# Patient Record
Sex: Female | Born: 1980 | Race: White | Hispanic: No | Marital: Married | Smoking: Never smoker
Health system: Southern US, Community
[De-identification: ages and names within clinical notes are randomized; demographics above are authoritative.]

## PROBLEM LIST (undated history)

## (undated) DIAGNOSIS — E669 Obesity, unspecified: Secondary | ICD-10-CM

## (undated) DIAGNOSIS — F329 Major depressive disorder, single episode, unspecified: Secondary | ICD-10-CM

## (undated) DIAGNOSIS — O26899 Other specified pregnancy related conditions, unspecified trimester: Secondary | ICD-10-CM

## (undated) DIAGNOSIS — R51 Headache: Secondary | ICD-10-CM

## (undated) DIAGNOSIS — Z8719 Personal history of other diseases of the digestive system: Secondary | ICD-10-CM

## (undated) DIAGNOSIS — R12 Heartburn: Secondary | ICD-10-CM

## (undated) DIAGNOSIS — G43909 Migraine, unspecified, not intractable, without status migrainosus: Secondary | ICD-10-CM

## (undated) DIAGNOSIS — F32A Depression, unspecified: Secondary | ICD-10-CM

## (undated) DIAGNOSIS — Z9889 Other specified postprocedural states: Secondary | ICD-10-CM

## (undated) DIAGNOSIS — R112 Nausea with vomiting, unspecified: Secondary | ICD-10-CM

## (undated) DIAGNOSIS — F419 Anxiety disorder, unspecified: Secondary | ICD-10-CM

## (undated) HISTORY — DX: Migraine, unspecified, not intractable, without status migrainosus: G43.909

## (undated) HISTORY — DX: Obesity, unspecified: E66.9

## (undated) HISTORY — PX: TONSILLECTOMY: SUR1361

## (undated) HISTORY — PX: WISDOM TOOTH EXTRACTION: SHX21

## (undated) HISTORY — PX: TONSILLECTOMY AND ADENOIDECTOMY: SHX28

## (undated) HISTORY — PX: BREAST REDUCTION SURGERY: SHX8

---

## 2001-03-12 HISTORY — PX: LIPOSUCTION: SHX10

## 2001-03-12 HISTORY — PX: BREAST REDUCTION SURGERY: SHX8

## 2003-04-30 ENCOUNTER — Other Ambulatory Visit: Admission: RE | Admit: 2003-04-30 | Discharge: 2003-04-30 | Payer: Self-pay | Admitting: Obstetrics & Gynecology

## 2003-10-04 ENCOUNTER — Encounter: Admission: RE | Admit: 2003-10-04 | Discharge: 2003-10-04 | Payer: Self-pay | Admitting: Obstetrics and Gynecology

## 2006-06-03 ENCOUNTER — Encounter: Admission: RE | Admit: 2006-06-03 | Discharge: 2006-09-01 | Payer: Self-pay | Admitting: Surgery

## 2006-06-07 ENCOUNTER — Ambulatory Visit (HOSPITAL_COMMUNITY): Admission: RE | Admit: 2006-06-07 | Discharge: 2006-06-07 | Payer: Self-pay | Admitting: Surgery

## 2006-06-28 ENCOUNTER — Ambulatory Visit (HOSPITAL_COMMUNITY): Admission: RE | Admit: 2006-06-28 | Discharge: 2006-06-28 | Payer: Self-pay | Admitting: Surgery

## 2007-05-27 ENCOUNTER — Encounter: Admission: RE | Admit: 2007-05-27 | Discharge: 2007-08-25 | Payer: Self-pay | Admitting: Family Medicine

## 2007-12-05 ENCOUNTER — Ambulatory Visit (HOSPITAL_COMMUNITY): Admission: RE | Admit: 2007-12-05 | Discharge: 2007-12-05 | Payer: Self-pay | Admitting: Surgery

## 2007-12-30 ENCOUNTER — Encounter: Admission: RE | Admit: 2007-12-30 | Discharge: 2008-01-20 | Payer: Self-pay | Admitting: Surgery

## 2008-02-02 ENCOUNTER — Encounter: Admission: RE | Admit: 2008-02-02 | Discharge: 2008-05-02 | Payer: Self-pay | Admitting: Surgery

## 2008-02-10 HISTORY — PX: HERNIA REPAIR: SHX51

## 2008-02-10 HISTORY — PX: LAPAROSCOPIC GASTRIC BANDING: SHX1100

## 2008-02-16 ENCOUNTER — Ambulatory Visit (HOSPITAL_COMMUNITY): Admission: RE | Admit: 2008-02-16 | Discharge: 2008-02-17 | Payer: Self-pay | Admitting: Surgery

## 2009-06-20 ENCOUNTER — Inpatient Hospital Stay (HOSPITAL_COMMUNITY): Admission: AD | Admit: 2009-06-20 | Discharge: 2009-06-24 | Payer: Self-pay | Admitting: Obstetrics

## 2009-06-22 ENCOUNTER — Encounter (INDEPENDENT_AMBULATORY_CARE_PROVIDER_SITE_OTHER): Payer: Self-pay | Admitting: Obstetrics

## 2009-06-25 ENCOUNTER — Encounter: Admission: RE | Admit: 2009-06-25 | Discharge: 2009-07-25 | Payer: Self-pay | Admitting: Obstetrics

## 2009-07-26 ENCOUNTER — Encounter: Admission: RE | Admit: 2009-07-26 | Discharge: 2009-08-22 | Payer: Self-pay | Admitting: Obstetrics

## 2010-05-31 LAB — CBC
HCT: 33.1 % — ABNORMAL LOW (ref 36.0–46.0)
HCT: 38.6 % (ref 36.0–46.0)
Hemoglobin: 11.3 g/dL — ABNORMAL LOW (ref 12.0–15.0)
Hemoglobin: 13.4 g/dL (ref 12.0–15.0)
MCHC: 34.2 g/dL (ref 30.0–36.0)
MCHC: 34.8 g/dL (ref 30.0–36.0)
MCV: 93.1 fL (ref 78.0–100.0)
MCV: 94.4 fL (ref 78.0–100.0)
Platelets: 171 10*3/uL (ref 150–400)
Platelets: 182 10*3/uL (ref 150–400)
RBC: 3.51 MIL/uL — ABNORMAL LOW (ref 3.87–5.11)
RBC: 4.15 MIL/uL (ref 3.87–5.11)
RDW: 13.3 % (ref 11.5–15.5)
RDW: 13.9 % (ref 11.5–15.5)
WBC: 14.6 10*3/uL — ABNORMAL HIGH (ref 4.0–10.5)
WBC: 18.5 10*3/uL — ABNORMAL HIGH (ref 4.0–10.5)

## 2010-05-31 LAB — GLUCOSE, CAPILLARY
Glucose-Capillary: 120 mg/dL — ABNORMAL HIGH (ref 70–99)
Glucose-Capillary: 75 mg/dL (ref 70–99)
Glucose-Capillary: 79 mg/dL (ref 70–99)
Glucose-Capillary: 82 mg/dL (ref 70–99)
Glucose-Capillary: 84 mg/dL (ref 70–99)
Glucose-Capillary: 85 mg/dL (ref 70–99)
Glucose-Capillary: 99 mg/dL (ref 70–99)

## 2010-05-31 LAB — TYPE AND SCREEN
ABO/RH(D): O POS
Antibody Screen: NEGATIVE

## 2010-05-31 LAB — RPR: RPR Ser Ql: NONREACTIVE

## 2010-05-31 LAB — ABO/RH: ABO/RH(D): O POS

## 2010-07-25 NOTE — Op Note (Signed)
NAMETEARAH, SAULSBURY               ACCOUNT NO.:  [id number]   MEDICAL RECORD NO.:  [id number]          PATIENT TYPE:  AMB   LOCATION:  DAY                          FACILITY:  [facility]   PHYSICIAN:  Sandria Bales. Ezzard Standing, M.D.  DATE OF BIRTH:  June 25, 1980   DATE OF PROCEDURE:  DATE OF DISCHARGE:                               OPERATIVE REPORT   Date of surgery ??   PREOPERATIVE DIAGNOSES:  1. Morbid obesity (weight 255, BMI of 43.65).  2. Small hiatal hernia.   POSTOPERATIVE DIAGNOSES:  1. Morbid obesity.  (Weight 255, BMI 43.65).  2. Small hiatal hernia.   PROCEDURES:  1. Repair of hiatal hernia (2-suture posterior repair).  2. Lap-Band placed with AP standard band.  3. Port sewn to fascia.   SURGEON:  Dr. Ezzard Standing.   FIRST ASSISTANT:  Sharlet Salina T. Hoxworth, M.D.   ANESTHESIA:  General endotracheal with approximately 3 cc of 0.25%  Marcaine.   COMPLICATIONS:  Ms. Erker is a 30 year old white female, patient of  Dr. Henrine Screws who has been through our preop bariatric probe and is  interested  in a Lap-Band placed.  On her preop evaluation she does have  reflux and on upper GI does have a small to medium hiatal hernia.   The risks for Lap-Band include bleeding, infection, perforation of the  bowel, slippage or erosion of the bowel and long-term nutritional  consequences.   Also I talked to her to possibly try to fix the hiatal hernia and the  risks of hiatel hernia repair are similar potential complications as the  band.   OPERATIVE NOTE:  The patient placed in supine position, given a general  endotracheal anesthetic.  Her abdomen was prepped with a Techni-Care  generic.  She had Ancef at the initiation of the procedure.  PAS  Stockings were in place.  She was sterilely draped.  A time-out was held  identifying the patient and procedure.   I accessed the abdominal cavity through the left upper quadrant with an  Optiview, 11 mm Ethicon trocar.  I placed 5 additional trocars,  a 5 mm  in the subxiphoid location, a 15 mm in the right subcostal, a 12 mm the  right paramedian, and an 11 mm in the left paramedian and then a 5-mm  lateral trocar.   The right and left lobes of liver were unremarkable.  The anterior wall  of the stomach was unremarkable.  The bowel that I could see was  unremarkable.   I then looked up the hiatus.  She does have a hiatal hernia which is  approximately 4 to 5 cm.  I did not do any kind of balloon test.   I started my dissection then, dissecting along the anterior surface of  the esophagus, I identified the left crus, identified the right crus and  went around posterior and got behind the esophagus, identified the left  crus behind the esophagus.   She had a defect which was probably 4-5 cm.  I repaired this with two 0  Ethibond sutures without pledgets that closed this hiatus down to about  3.5 cm.  I then had anesthesia pass the Lap-Band balloon into the  stomach, insufflated with 15 mL and it was pulled until it was snug so  it looked like this at least closed the hiatus to a small enough  diameter to make it snug to our balloon test.   I then inserted an AP standard band into the abdomen.  I passed this in  our normal fashion behind the gastroesophageal junction and I  used a  finger to pass from the right side to the left side, then captured the  Silastic balloon and pulled this around the proximal stomach.   I put the sizer back into the stomach and I cinched the tube down of the  sizer which she did easily with and she pulled the sizer out and the  band seemed to lie in a good location.  I then imbricated the stomach  over the band using 0-Ethibond suture.  I used 3 of these sutures,  again, imbricated this over the AP standard band.  The band seemed to  lie without having much tension over it.  There was a little oozing from  1 suture.   I thought the band laid flat.  I took photos both of the band and the  hiatal  hernia.  I then removed the Silastic tubing out through the right  mid abdominal port.  I removed the other trocars in turn.  The trocar  site was also removed.   I then developed a pocket in the right mid abdomen.  I put the tubing to  the reservoir, sewed the reservoir in place with a port in place with a  0-Prolene suture and this laid flat against the fascia.  I then  irrigated the wound, closed the subcutaneous tissue with 3-0 Vicryl  suture, closed the skin with 5-0 Vicryl, reduced the site.  The wound  was tacked with Dermabond.   The patient tolerated the procedure well, was transported to recovery  room in good condition.  Sponge and needle counts  were correct at the  end of the case.      Sandria Bales. Ezzard Standing, M.D.  Electronically Signed     DHN/MEDQ  D:  02/16/2008  T:  02/16/2008  Job:  [id number]   cc:   Chales Salmon. Abigail Miyamoto, M.D.  Fax: [phone]   Adonis Housekeeper, Ph.D.  Fax: [phone]

## 2010-09-27 ENCOUNTER — Encounter (INDEPENDENT_AMBULATORY_CARE_PROVIDER_SITE_OTHER): Payer: Self-pay | Admitting: Surgery

## 2010-09-27 LAB — ABO/RH: RH Type: POSITIVE

## 2010-09-27 LAB — RUBELLA ANTIBODY, IGM: Rubella: IMMUNE

## 2010-09-27 LAB — HEPATITIS B SURFACE ANTIGEN: Hepatitis B Surface Ag: NEGATIVE

## 2010-09-27 LAB — HIV ANTIBODY (ROUTINE TESTING W REFLEX): HIV: NONREACTIVE

## 2010-10-05 ENCOUNTER — Ambulatory Visit (INDEPENDENT_AMBULATORY_CARE_PROVIDER_SITE_OTHER): Payer: BC Managed Care – PPO | Admitting: Surgery

## 2010-10-05 DIAGNOSIS — Z9884 Bariatric surgery status: Secondary | ICD-10-CM

## 2010-10-05 DIAGNOSIS — Z331 Pregnant state, incidental: Secondary | ICD-10-CM

## 2010-10-05 NOTE — Progress Notes (Signed)
Jacqueline Brooks is a 30 year old white female, a patient of Dr. Levonne Lapping, who had an AP standard lap band placed by me on  16 February 2008.  I last saw all the patient on 12 August 2009 soon after her baby boy was born. She now comes back again because she is pregnant and expecting in February of 2013.  She does not want any fluid removed.  She was encouraged to see [place] by her Ob/Gyn Dr. Payton Doughty.  She has no new issues. We talked to her about her lap band and the management around her pregnancy. Last pregnancy she had some nausea and vomiting right before her delivery which was a C-section. She may want to have some fluid removed from her band close to the date of her delivery. I told her we would work with her for whatever would makes her comfortable. I would be happy to see her for any problems.  Physical exam: Wt. 209.  BMI 35.8. Abdomen: Soft. Incisions well healed. LAP-BAND port palpable.  1.  Lap band, AP standard  Doing well.  She is down about 60 pounds from her pre surgery weight.  But she has recently added about 10 pounds since she found out she was pregnant.  2.  Pregnant.  Due around Feb. 2013. She may contact us about 1 week before delivery.  Last pregnancy (she has a 4 month old boy) she has some vomiting one week before the delivery.  Last time she had a C section.

## 2010-10-05 NOTE — Patient Instructions (Addendum)
1.  Congratulations on pregnancy.  2.  I will be happy to see to remove fluid before your delivery.  If not then, let me see you Dec. 2013 (your lap band anniversary).  3.  You're doing well with the lap band.

## 2010-12-15 LAB — COMPREHENSIVE METABOLIC PANEL
ALT: 18 U/L (ref 0–35)
AST: 22 U/L (ref 0–37)
Albumin: 3.8 g/dL (ref 3.5–5.2)
Alkaline Phosphatase: 42 U/L (ref 39–117)
BUN: 9 mg/dL (ref 6–23)
CO2: 25 mEq/L (ref 19–32)
Calcium: 9.3 mg/dL (ref 8.4–10.5)
Chloride: 106 mEq/L (ref 96–112)
Creatinine, Ser: 1.08 mg/dL (ref 0.4–1.2)
GFR calc Af Amer: >60 mL/min (ref >60)
GFR calc non Af Amer: >60 mL/min (ref >60)
Glucose, Bld: 89 mg/dL (ref 70–99)
Potassium: 4.1 mEq/L (ref 3.5–5.1)
Sodium: 140 mEq/L (ref 135–145)
Total Bilirubin: 1 mg/dL (ref 0.3–1.2)
Total Protein: 6.4 g/dL (ref 6.0–8.3)

## 2010-12-15 LAB — DIFFERENTIAL
Basophils Absolute: 0 10*3/uL (ref 0.0–0.1)
Basophils Absolute: 0.1 10*3/uL (ref 0.0–0.1)
Basophils Relative: 0 % (ref 0–1)
Basophils Relative: 1 % (ref 0–1)
Eosinophils Absolute: 0 10*3/uL (ref 0.0–0.7)
Eosinophils Absolute: 0.1 10*3/uL (ref 0.0–0.7)
Eosinophils Relative: 0 % (ref 0–5)
Eosinophils Relative: 0 % (ref 0–5)
Lymphocytes Relative: 10 % — ABNORMAL LOW (ref 12–46)
Lymphocytes Relative: 25 % (ref 12–46)
Lymphs Abs: 1.5 10*3/uL (ref 0.7–4.0)
Lymphs Abs: 3.1 10*3/uL (ref 0.7–4.0)
Monocytes Absolute: 0.8 10*3/uL (ref 0.1–1.0)
Monocytes Absolute: 1.2 10*3/uL — ABNORMAL HIGH (ref 0.1–1.0)
Monocytes Relative: 7 % (ref 3–12)
Monocytes Relative: 8 % (ref 3–12)
Neutro Abs: 12.4 10*3/uL — ABNORMAL HIGH (ref 1.7–7.7)
Neutro Abs: 8.4 10*3/uL — ABNORMAL HIGH (ref 1.7–7.7)
Neutrophils Relative %: 67 % (ref 43–77)
Neutrophils Relative %: 82 % — ABNORMAL HIGH (ref 43–77)

## 2010-12-15 LAB — CBC
HCT: 41.8 % (ref 36.0–46.0)
HCT: 42.4 % (ref 36.0–46.0)
Hemoglobin: 14.3 g/dL (ref 12.0–15.0)
Hemoglobin: 14.4 g/dL (ref 12.0–15.0)
MCHC: 33.9 g/dL (ref 30.0–36.0)
MCHC: 34.1 g/dL (ref 30.0–36.0)
MCV: 90.8 fL (ref 78.0–100.0)
MCV: 91.2 fL (ref 78.0–100.0)
Platelets: 314 10*3/uL (ref 150–400)
Platelets: 330 10*3/uL (ref 150–400)
RBC: 4.59 MIL/uL (ref 3.87–5.11)
RBC: 4.67 MIL/uL (ref 3.87–5.11)
RDW: 12.5 % (ref 11.5–15.5)
RDW: 13.2 % (ref 11.5–15.5)
WBC: 12.5 10*3/uL — ABNORMAL HIGH (ref 4.0–10.5)
WBC: 15.2 10*3/uL — ABNORMAL HIGH (ref 4.0–10.5)

## 2010-12-15 LAB — PREGNANCY, URINE: Preg Test, Ur: NEGATIVE

## 2010-12-18 ENCOUNTER — Other Ambulatory Visit: Payer: Self-pay | Admitting: Dermatology

## 2011-02-05 LAB — RPR: RPR: NONREACTIVE

## 2011-04-13 ENCOUNTER — Other Ambulatory Visit: Payer: Self-pay | Admitting: Obstetrics

## 2011-04-13 ENCOUNTER — Encounter (INDEPENDENT_AMBULATORY_CARE_PROVIDER_SITE_OTHER): Payer: Self-pay

## 2011-04-13 ENCOUNTER — Ambulatory Visit (INDEPENDENT_AMBULATORY_CARE_PROVIDER_SITE_OTHER): Payer: BC Managed Care – PPO | Admitting: Physician Assistant

## 2011-04-13 ENCOUNTER — Encounter (HOSPITAL_COMMUNITY): Payer: Self-pay

## 2011-04-13 VITALS — BP 112/76 | HR 68 | Temp 97.6°F | Resp 18 | Ht 64.0 in | Wt 226.2 lb

## 2011-04-13 DIAGNOSIS — Z4651 Encounter for fitting and adjustment of gastric lap band: Secondary | ICD-10-CM

## 2011-04-13 NOTE — Patient Instructions (Signed)
Return as scheduled 

## 2011-04-13 NOTE — Progress Notes (Signed)
  HISTORY: Jacqueline Brooks is a 31 y.o.female who received an AP-Standard lap-band in December 2009 by Dr. Ezzard Standing. She is delivering via c-section on 2/15 and would like fluid removed as she had nausea and vomiting following her last c-section. Otherwise, she has no complaints.Marland Kitchen  VITAL SIGNS: Filed Vitals:   04/13/11 1618  BP: 112/76  Pulse: 68  Temp: 97.6 F (36.4 C)  Resp: 18    PHYSICAL EXAM: Physical exam reveals a very well-appearing 31 y.o.female in no apparent distress Neurologic: Awake, alert, oriented Psych: Bright affect, conversant Respiratory: Breathing even and unlabored. No stridor or wheezing Abdomen: Soft, nontender, nondistended to palpation. Incisions well-healed. No incisional hernias. Port easily palpated. Extremities: Atraumatic, good range of motion.  ASSESMENT: 31 y.o.  female  s/p AP-Standard lap-band.   PLAN: The patient's port was accessed with a 20G Huber needle without difficulty. Clear fluid was aspirated and 5.5 mL saline was removed from the port to give a total predicted volume of 0 mL.

## 2011-04-16 ENCOUNTER — Encounter (HOSPITAL_COMMUNITY)
Admission: RE | Admit: 2011-04-16 | Discharge: 2011-04-16 | Disposition: A | Discharge status: Home / Self Care | Payer: BC Managed Care – PPO | Source: Ambulatory Visit | Attending: Obstetrics | Admitting: Obstetrics

## 2011-04-16 ENCOUNTER — Encounter (HOSPITAL_COMMUNITY): Payer: Self-pay

## 2011-04-16 HISTORY — DX: Anxiety disorder, unspecified: F41.9

## 2011-04-16 HISTORY — DX: Major depressive disorder, single episode, unspecified: F32.9

## 2011-04-16 HISTORY — DX: Other specified pregnancy related conditions, unspecified trimester: O26.899

## 2011-04-16 HISTORY — DX: Depression, unspecified: F32.A

## 2011-04-16 HISTORY — DX: Other specified postprocedural states: Z98.890

## 2011-04-16 HISTORY — DX: Nausea with vomiting, unspecified: R11.2

## 2011-04-16 HISTORY — DX: Personal history of other diseases of the digestive system: Z87.19

## 2011-04-16 HISTORY — DX: Heartburn: R12

## 2011-04-16 HISTORY — DX: Headache: R51

## 2011-04-16 LAB — CBC
HCT: 37 % (ref 36.0–46.0)
Hemoglobin: 12.1 g/dL (ref 12.0–15.0)
MCH: 30.4 pg (ref 26.0–34.0)
MCHC: 32.7 g/dL (ref 30.0–36.0)
MCV: 93 fL (ref 78.0–100.0)
Platelets: 207 10*3/uL (ref 150–400)
RBC: 3.98 MIL/uL (ref 3.87–5.11)
RDW: 13.6 % (ref 11.5–15.5)
WBC: 13.4 10*3/uL — ABNORMAL HIGH (ref 4.0–10.5)

## 2011-04-16 LAB — SURGICAL PCR SCREEN
MRSA, PCR: NEGATIVE
Staphylococcus aureus: NEGATIVE

## 2011-04-16 NOTE — Patient Instructions (Addendum)
   Your procedure is scheduled on: Friday, Feb 15th  Enter through the Main Entrance of [facility] at: [employer] up the phone at the desk and dial [phone] and inform us of your arrival.  Please call this number if you have any problems the morning of surgery: [phone]  Remember: Do not eat food after midnight: Thursday Do not drink clear liquids after: Thursday Take these medicines the morning of surgery with a SIP OF WATER: prozac  Do not wear jewelry, make-up, or FINGER nail polish Do not wear lotions, powders, perfumes or deodorant. Do not shave 48 hours prior to surgery. Do not bring valuables to the hospital.  Leave suitcase in the car. After Surgery it may be brought to your room. For patients being admitted to the hospital, checkout time is 11:00am the day of discharge. Home with Husband Shadee Rathod cell [phone]  Patients discharged on the day of surgery will not be allowed to drive home.     Remember to use your hibiclens as instructed.Please shower with 1/2 bottle the evening before your surgery and the other 1/2 bottle the morning of surgery.

## 2011-04-16 NOTE — Pre-Procedure Instructions (Signed)
Ok to see patient DOS. 

## 2011-04-17 LAB — RPR: RPR Ser Ql: NONREACTIVE

## 2011-04-27 ENCOUNTER — Inpatient Hospital Stay (HOSPITAL_COMMUNITY)
Admission: RE | Admit: 2011-04-27 | Discharge: 2011-04-29 | DRG: 371 | Disposition: A | Discharge status: Home / Self Care | Payer: BC Managed Care – PPO | Source: Ambulatory Visit | Attending: Obstetrics | Admitting: Obstetrics

## 2011-04-27 ENCOUNTER — Encounter (HOSPITAL_COMMUNITY)
Admission: RE | Disposition: A | Discharge status: Home / Self Care | Payer: Self-pay | Source: Ambulatory Visit | Attending: Obstetrics

## 2011-04-27 ENCOUNTER — Encounter (HOSPITAL_COMMUNITY): Payer: Self-pay | Admitting: Anesthesiology

## 2011-04-27 ENCOUNTER — Inpatient Hospital Stay (HOSPITAL_COMMUNITY): Payer: BC Managed Care – PPO | Admitting: Anesthesiology

## 2011-04-27 ENCOUNTER — Encounter (HOSPITAL_COMMUNITY): Payer: Self-pay | Admitting: *Deleted

## 2011-04-27 DIAGNOSIS — O339 Maternal care for disproportion, unspecified: Secondary | ICD-10-CM | POA: Diagnosis present

## 2011-04-27 DIAGNOSIS — O99844 Bariatric surgery status complicating childbirth: Secondary | ICD-10-CM | POA: Diagnosis present

## 2011-04-27 DIAGNOSIS — O330 Maternal care for disproportion due to deformity of maternal pelvic bones: Secondary | ICD-10-CM | POA: Diagnosis present

## 2011-04-27 DIAGNOSIS — O34219 Maternal care for unspecified type scar from previous cesarean delivery: Principal | ICD-10-CM | POA: Diagnosis present

## 2011-04-27 LAB — URINALYSIS, ROUTINE W REFLEX MICROSCOPIC
Bilirubin Urine: NEGATIVE
Glucose, UA: NEGATIVE mg/dL
Hgb urine dipstick: NEGATIVE
Ketones, ur: NEGATIVE mg/dL
Nitrite: NEGATIVE
Protein, ur: NEGATIVE mg/dL
Specific Gravity, Urine: 1.015 (ref 1.005–1.030)
Urobilinogen, UA: 0.2 mg/dL (ref 0.0–1.0)
pH: 7.5 (ref 5.0–8.0)

## 2011-04-27 LAB — URINE MICROSCOPIC-ADD ON

## 2011-04-27 LAB — TYPE AND SCREEN
ABO/RH(D): O POS
Antibody Screen: NEGATIVE

## 2011-04-27 SURGERY — Surgical Case
Anesthesia: Spinal | Site: Abdomen | Wound class: Clean Contaminated

## 2011-04-27 MED ORDER — NALOXONE HCL 0.4 MG/ML IJ SOLN: 0.4000 mg | INTRAMUSCULAR | Status: DC | PRN

## 2011-04-27 MED ORDER — NALBUPHINE HCL 10 MG/ML IJ SOLN
5.0000 mg | INTRAMUSCULAR | Status: DC | PRN
  Filled 2011-04-27: qty 1

## 2011-04-27 MED ORDER — METOCLOPRAMIDE HCL 5 MG/ML IJ SOLN: 10.0000 mg | Freq: Three times a day (TID) | INTRAMUSCULAR | Status: DC | PRN

## 2011-04-27 MED ORDER — MORPHINE SULFATE (PF) 0.5 MG/ML IJ SOLN
INTRAMUSCULAR | Status: DC | PRN
  Administered 2011-04-27: .1 mg via EPIDURAL

## 2011-04-27 MED ORDER — SODIUM CHLORIDE 0.9 % IJ SOLN: 3.0000 mL | INTRAMUSCULAR | Status: DC | PRN

## 2011-04-27 MED ORDER — ZOLPIDEM TARTRATE 5 MG PO TABS: 5.0000 mg | ORAL_TABLET | Freq: Every evening | ORAL | Status: DC | PRN

## 2011-04-27 MED ORDER — ONDANSETRON HCL 4 MG PO TABS: 4.0000 mg | ORAL_TABLET | ORAL | Status: DC | PRN

## 2011-04-27 MED ORDER — OXYTOCIN 10 UNIT/ML IJ SOLN
INTRAMUSCULAR | Status: AC
  Filled 2011-04-27: qty 1

## 2011-04-27 MED ORDER — FLUOXETINE HCL 20 MG PO CAPS
20.0000 mg | ORAL_CAPSULE | Freq: Every day | ORAL | Status: DC
  Administered 2011-04-29: 20 mg via ORAL
  Filled 2011-04-27 (×3): qty 1

## 2011-04-27 MED ORDER — DIPHENHYDRAMINE HCL 50 MG/ML IJ SOLN: 25.0000 mg | INTRAMUSCULAR | Status: DC | PRN

## 2011-04-27 MED ORDER — CEFAZOLIN SODIUM 1-5 GM-% IV SOLN: 1.0000 g | INTRAVENOUS | Status: DC

## 2011-04-27 MED ORDER — IBUPROFEN 600 MG PO TABS: 600.0000 mg | ORAL_TABLET | Freq: Four times a day (QID) | ORAL | Status: DC | PRN

## 2011-04-27 MED ORDER — BUPIVACAINE IN DEXTROSE 0.75-8.25 % IT SOLN
INTRATHECAL | Status: DC | PRN
  Administered 2011-04-27: 1.4 mL via INTRATHECAL

## 2011-04-27 MED ORDER — SCOPOLAMINE 1 MG/3DAYS TD PT72
1.0000 | MEDICATED_PATCH | Freq: Once | TRANSDERMAL | Status: DC
  Filled 2011-04-27: qty 1

## 2011-04-27 MED ORDER — DIPHENHYDRAMINE HCL 25 MG PO CAPS: 25.0000 mg | ORAL_CAPSULE | Freq: Four times a day (QID) | ORAL | Status: DC | PRN

## 2011-04-27 MED ORDER — MENTHOL 3 MG MT LOZG: 1.0000 | LOZENGE | OROMUCOSAL | Status: DC | PRN

## 2011-04-27 MED ORDER — CALCIUM CARBONATE ANTACID 500 MG PO CHEW: 2.0000 | CHEWABLE_TABLET | Freq: Every day | ORAL | Status: DC

## 2011-04-27 MED ORDER — SCOPOLAMINE 1 MG/3DAYS TD PT72
1.0000 | MEDICATED_PATCH | Freq: Once | TRANSDERMAL | Status: DC
  Administered 2011-04-27: 1.5 mg via TRANSDERMAL

## 2011-04-27 MED ORDER — LACTATED RINGERS IV SOLN
INTRAVENOUS | Status: DC
  Administered 2011-04-27 (×2): via INTRAVENOUS

## 2011-04-27 MED ORDER — KETOROLAC TROMETHAMINE 60 MG/2ML IM SOLN
INTRAMUSCULAR | Status: AC
  Administered 2011-04-27: 60 mg via INTRAMUSCULAR
  Filled 2011-04-27: qty 2

## 2011-04-27 MED ORDER — ONDANSETRON HCL 4 MG/2ML IJ SOLN: 4.0000 mg | Freq: Three times a day (TID) | INTRAMUSCULAR | Status: DC | PRN

## 2011-04-27 MED ORDER — LANOLIN HYDROUS EX OINT: 1.0000 "application " | TOPICAL_OINTMENT | CUTANEOUS | Status: DC | PRN

## 2011-04-27 MED ORDER — WITCH HAZEL-GLYCERIN EX PADS: 1.0000 "application " | MEDICATED_PAD | CUTANEOUS | Status: DC | PRN

## 2011-04-27 MED ORDER — ONDANSETRON HCL 4 MG/2ML IJ SOLN
INTRAMUSCULAR | Status: AC
  Filled 2011-04-27: qty 2

## 2011-04-27 MED ORDER — ONDANSETRON HCL 4 MG/2ML IJ SOLN
INTRAMUSCULAR | Status: DC | PRN
  Administered 2011-04-27: 4 mg via INTRAVENOUS

## 2011-04-27 MED ORDER — SENNOSIDES-DOCUSATE SODIUM 8.6-50 MG PO TABS
2.0000 | ORAL_TABLET | Freq: Every day | ORAL | Status: DC
  Administered 2011-04-27 – 2011-04-28 (×2): 2 via ORAL

## 2011-04-27 MED ORDER — LACTATED RINGERS IV SOLN
INTRAVENOUS | Status: DC
  Administered 2011-04-27: 07:00:00 via INTRAVENOUS

## 2011-04-27 MED ORDER — OXYTOCIN 10 UNIT/ML IJ SOLN
INTRAMUSCULAR | Status: AC
  Filled 2011-04-27: qty 2

## 2011-04-27 MED ORDER — FENTANYL CITRATE 0.05 MG/ML IJ SOLN
INTRAMUSCULAR | Status: DC | PRN
  Administered 2011-04-27: 12.5 ug via INTRAVENOUS

## 2011-04-27 MED ORDER — VITAMIN D3 25 MCG (1000 UNIT) PO TABS
2000.0000 [IU] | ORAL_TABLET | Freq: Every day | ORAL | Status: DC
  Administered 2011-04-28 – 2011-04-29 (×2): 2000 [IU] via ORAL
  Filled 2011-04-27 (×3): qty 2

## 2011-04-27 MED ORDER — DIPHENHYDRAMINE HCL 50 MG/ML IJ SOLN: 12.5000 mg | INTRAMUSCULAR | Status: DC | PRN

## 2011-04-27 MED ORDER — KETOROLAC TROMETHAMINE 60 MG/2ML IM SOLN
60.0000 mg | Freq: Once | INTRAMUSCULAR | Status: AC | PRN
  Administered 2011-04-27: 60 mg via INTRAMUSCULAR

## 2011-04-27 MED ORDER — OXYCODONE-ACETAMINOPHEN 5-325 MG PO TABS
1.0000 | ORAL_TABLET | ORAL | Status: DC | PRN
  Administered 2011-04-28 – 2011-04-29 (×2): 1 via ORAL
  Filled 2011-04-27 (×2): qty 1

## 2011-04-27 MED ORDER — SCOPOLAMINE 1 MG/3DAYS TD PT72: 1.0000 | MEDICATED_PATCH | Freq: Once | TRANSDERMAL | Status: DC

## 2011-04-27 MED ORDER — OXYTOCIN 20 UNITS IN LACTATED RINGERS INFUSION - SIMPLE: 125.0000 mL/h | INTRAVENOUS | Status: AC

## 2011-04-27 MED ORDER — FENTANYL CITRATE 0.05 MG/ML IJ SOLN
25.0000 ug | INTRAMUSCULAR | Status: DC | PRN
  Administered 2011-04-27 (×2): 25 ug via INTRAVENOUS

## 2011-04-27 MED ORDER — FENTANYL CITRATE 0.05 MG/ML IJ SOLN
INTRAMUSCULAR | Status: AC
  Administered 2011-04-27: 25 ug via INTRAVENOUS
  Filled 2011-04-27: qty 2

## 2011-04-27 MED ORDER — SIMETHICONE 80 MG PO CHEW
80.0000 mg | CHEWABLE_TABLET | Freq: Three times a day (TID) | ORAL | Status: DC
  Administered 2011-04-27 – 2011-04-28 (×6): 80 mg via ORAL

## 2011-04-27 MED ORDER — KETOROLAC TROMETHAMINE 30 MG/ML IJ SOLN: 30.0000 mg | Freq: Four times a day (QID) | INTRAMUSCULAR | Status: AC | PRN

## 2011-04-27 MED ORDER — ONDANSETRON HCL 4 MG/2ML IJ SOLN: 4.0000 mg | INTRAMUSCULAR | Status: DC | PRN

## 2011-04-27 MED ORDER — OXYTOCIN 10 UNIT/ML IJ SOLN
INTRAMUSCULAR | Status: DC | PRN
  Administered 2011-04-27: 5 [IU]
  Administered 2011-04-27: 20 [IU]

## 2011-04-27 MED ORDER — FENTANYL CITRATE 0.05 MG/ML IJ SOLN
INTRAMUSCULAR | Status: AC
  Filled 2011-04-27: qty 2

## 2011-04-27 MED ORDER — PHENYLEPHRINE HCL 10 MG/ML IJ SOLN
INTRAMUSCULAR | Status: DC | PRN
  Administered 2011-04-27 (×6): 40 ug via INTRAVENOUS

## 2011-04-27 MED ORDER — DIBUCAINE 1 % RE OINT: 1.0000 "application " | TOPICAL_OINTMENT | RECTAL | Status: DC | PRN

## 2011-04-27 MED ORDER — LACTATED RINGERS IV SOLN
INTRAVENOUS | Status: DC
  Administered 2011-04-27: 13:00:00 via INTRAVENOUS

## 2011-04-27 MED ORDER — DIPHENHYDRAMINE HCL 25 MG PO CAPS: 25.0000 mg | ORAL_CAPSULE | ORAL | Status: DC | PRN

## 2011-04-27 MED ORDER — IBUPROFEN 600 MG PO TABS
600.0000 mg | ORAL_TABLET | Freq: Four times a day (QID) | ORAL | Status: DC
  Administered 2011-04-27 – 2011-04-29 (×7): 600 mg via ORAL
  Filled 2011-04-27 (×7): qty 1

## 2011-04-27 MED ORDER — CEFAZOLIN SODIUM 1-5 GM-% IV SOLN
INTRAVENOUS | Status: AC
  Administered 2011-04-27: 1 g via INTRAVENOUS
  Filled 2011-04-27: qty 50

## 2011-04-27 MED ORDER — SIMETHICONE 80 MG PO CHEW: 80.0000 mg | CHEWABLE_TABLET | ORAL | Status: DC | PRN

## 2011-04-27 MED ORDER — SCOPOLAMINE 1 MG/3DAYS TD PT72
MEDICATED_PATCH | TRANSDERMAL | Status: AC
  Administered 2011-04-27: 1.5 mg via TRANSDERMAL
  Filled 2011-04-27: qty 1

## 2011-04-27 MED ORDER — PRENATAL MULTIVITAMIN CH
1.0000 | ORAL_TABLET | Freq: Every day | ORAL | Status: DC
  Administered 2011-04-28 – 2011-04-29 (×2): 1 via ORAL
  Filled 2011-04-27: qty 1

## 2011-04-27 MED ORDER — L-METHYLFOLATE-B6-B12 3-35-2 MG PO TABS
1.0000 | ORAL_TABLET | Freq: Every day | ORAL | Status: DC
  Administered 2011-04-28 – 2011-04-29 (×2): 1 via ORAL
  Filled 2011-04-27 (×4): qty 1

## 2011-04-27 MED ORDER — MEPERIDINE HCL 25 MG/ML IJ SOLN: 6.2500 mg | INTRAMUSCULAR | Status: DC | PRN

## 2011-04-27 MED ORDER — TETANUS-DIPHTH-ACELL PERTUSSIS 5-2.5-18.5 LF-MCG/0.5 IM SUSP
0.5000 mL | Freq: Once | INTRAMUSCULAR | Status: AC
  Administered 2011-04-28: 0.5 mL via INTRAMUSCULAR
  Filled 2011-04-27: qty 0.5

## 2011-04-27 MED ORDER — MORPHINE SULFATE 0.5 MG/ML IJ SOLN
INTRAMUSCULAR | Status: AC
  Filled 2011-04-27: qty 10

## 2011-04-27 MED ORDER — SODIUM CHLORIDE 0.9 % IV SOLN: 1.0000 ug/kg/h | INTRAVENOUS | Status: DC | PRN

## 2011-04-27 SURGICAL SUPPLY — 24 items
ADH SKN CLS APL DERMABOND .7 (GAUZE/BANDAGES/DRESSINGS) ×1
CHLORAPREP W/TINT 26ML (MISCELLANEOUS) ×2 IMPLANT
CLOTH BEACON ORANGE TIMEOUT ST (SAFETY) ×2 IMPLANT
DERMABOND ADVANCED (GAUZE/BANDAGES/DRESSINGS) ×1
DERMABOND ADVANCED .7 DNX12 (GAUZE/BANDAGES/DRESSINGS) IMPLANT
ELECT REM PT RETURN 9FT ADLT (ELECTROSURGICAL) ×2
ELECTRODE REM PT RTRN 9FT ADLT (ELECTROSURGICAL) ×1 IMPLANT
GLOVE BIO SURGEON STRL SZ 6.5 (GLOVE) ×2 IMPLANT
GLOVE BIOGEL PI IND STRL 7.0 (GLOVE) ×2 IMPLANT
GLOVE BIOGEL PI INDICATOR 7.0 (GLOVE) ×2
GOWN PREVENTION PLUS LG XLONG (DISPOSABLE) ×6 IMPLANT
NS IRRIG 1000ML POUR BTL (IV SOLUTION) ×2 IMPLANT
PACK C SECTION WH (CUSTOM PROCEDURE TRAY) ×2 IMPLANT
STAPLER VISISTAT 35W (STAPLE) IMPLANT
SUT MON AB 4-0 PS1 27 (SUTURE) ×1 IMPLANT
SUT PLAIN 2 0 XLH (SUTURE) ×1 IMPLANT
SUT VIC AB 0 CT1 36 (SUTURE) ×5 IMPLANT
SUT VIC AB 0 CTX 36 (SUTURE) ×4
SUT VIC AB 0 CTX36XBRD ANBCTRL (SUTURE) ×3 IMPLANT
SUT VIC AB 2-0 CT1 27 (SUTURE) ×2
SUT VIC AB 2-0 CT1 TAPERPNT 27 (SUTURE) ×1 IMPLANT
TOWEL OR 17X24 6PK STRL BLUE (TOWEL DISPOSABLE) ×4 IMPLANT
TRAY FOLEY CATH 14FR (SET/KITS/TRAYS/PACK) ×1 IMPLANT
WATER STERILE IRR 1000ML POUR (IV SOLUTION) ×2 IMPLANT

## 2011-04-27 NOTE — Brief Op Note (Signed)
04/27/2011  8:48 AM  PATIENT:  Jacqueline Brooks  31 y.o. female  PRE-OPERATIVE DIAGNOSIS:  Previous Cesarean Section  POST-OPERATIVE DIAGNOSIS:  Previous Cesarean Section  PROCEDURE:  Procedure(s) (LRB): CESAREAN SECTION (N/A), LTCS w/ 2 layer closure  SURGEON:  Surgeon(s) and Role:    * Quitman Norberto A. Ernestina Penna, MD - Primary    * Genia Del, MD - Assisting  PHYSICIAN ASSISTANT:   ASSISTANTS: Lavoie   ANESTHESIA:   spinal  EBL:  Total I/O In: 3000 [I.V.:3000] Out: 1000 [Urine:300; Blood:700]  BLOOD ADMINISTERED:none  DRAINS: Urinary Catheter (Foley)   LOCAL MEDICATIONS USED:  NONE  SPECIMEN:  Source of Specimen:  placenta  DISPOSITION OF SPECIMEN:  L&D  COUNTS:  YES  TOURNIQUET:  * No tourniquets in log *  DICTATION: .Note written in EPIC  PLAN OF CARE: Admit to inpatient   PATIENT DISPOSITION:  PACU - hemodynamically stable.   Delay start of Pharmacological VTE agent (>24hrs) due to surgical blood loss or risk of bleeding: yes

## 2011-04-27 NOTE — Anesthesia Procedure Notes (Signed)
Spinal  Patient location during procedure: OR Start time: 04/27/2011 7:46 AM End time: 04/27/2011 7:49 AM Staffing Anesthesiologist: Sandrea Hughs Performed by: anesthesiologist  Preanesthetic Checklist Completed: patient identified, site marked, surgical consent, pre-op evaluation, timeout performed, IV checked, risks and benefits discussed and monitors and equipment checked Spinal Block Patient position: sitting Prep: DuraPrep Patient monitoring: heart rate, cardiac monitor, continuous pulse ox and blood pressure Approach: midline Location: L3-4 Injection technique: single-shot Needle Needle type: Sprotte  Needle gauge: 24 G Needle length: 9 cm Needle insertion depth: 6 cm Assessment Sensory level: T4

## 2011-04-27 NOTE — Addendum Note (Signed)
Addendum  created 04/27/11 1935 by Rosalia Hammers, CRNA   Modules edited:Notes Section

## 2011-04-27 NOTE — Anesthesia Postprocedure Evaluation (Signed)
  Anesthesia Post-op Note  Patient: Jacqueline Brooks  Procedure(s) Performed: Procedure(s) (LRB): CESAREAN SECTION (N/A)  Patient Location: PACU  Anesthesia Type: Spinal  Level of Consciousness: awake, alert  and oriented  Airway and Oxygen Therapy: Patient Spontanous Breathing  Post-op Pain: none  Post-op Assessment: Post-op Vital signs reviewed, Patient's Cardiovascular Status Stable, Respiratory Function Stable, Patent Airway, No signs of Nausea or vomiting, Pain level controlled, No headache and No backache  Post-op Vital Signs: Reviewed and stable  Complications: No apparent anesthesia complications

## 2011-04-27 NOTE — Op Note (Signed)
Preoperative diagnosis: prior c/s for CPD  Postop diagnosis:  same Procedure: RCS Surgeon: Viviann Spare Assistant:. Seymour Bars Anesthesia: spinal Findings:  @BABYSEXEBC @ infant,  APGAR (1 MIN): 9   APGAR (5 MINS): 9   APGAR (10 MINS):   @BABYWGTLBSEBC @ @BABYWGTOZEBC @ Wt 7'12 EBL: 700 cc Antibiotics:  1g Ancef  Complications: none  Indications: This is a 31 y.o. year-old, G2P1  At [redacted]w[redacted]d admitted for RCS. Risks benefits and alternatives of the procedure were discussed with the patient who agreed to proceed  Procedure:  After informed consent was obtained the patient was taken to the operating room where spinal anesthesia was initiated.  She was prepped and draped in the normal sterile fashion in dorsal supine position with a leftward tilt.  A foley catheter was inserted sterilely into the bladder. Gloves were changes and attention was turned to the patient's abdomen. A Pfannenstiel skin incision was made 2 cm above the pubic symphysis in the midline with the scalpel, over the old incision.  Dissection was carried down with the Bovie cautery until the fascia was reached. The fascia was incised in the midline. The incision was extended laterally with the Mayo scissors. The inferior aspect of the fascial incision was grasped with the Coker clamps, elevated up and the underlying rectus muscles were dissected off sharply. The superior aspect of the fascial incision was grasped with the Coker clamps elevated up and the underlying rectus muscles were dissected off sharply.  The peritoneum was entered sharply using snaps and metzenbaums. The peritoneal incision was extended superiorly and inferiorly with good visualization of the bladder. The bladder blade was inserted, the vesicouterine peritoneum was identified grasped with the pickups and entered sharply. The bladder flap was created digitally the bladder blade was reinserted. Palpation was done to assess the fetal position and the location of the uterine  vessels. The lower segment of the uterus was incised sharply with the scalpel and extended superiorly and laterally with the bandage scissors. The infant also was grasped brought to the incision,  rotated and the infant was delivered with fundal pressure. The nose and mouth were bulb suctioned. The cord was clamped and cut. The infant was handed off to the waiting pediatrician. The placenta was expressed. The uterus was exteriorized. The uterus was cleared of all clots and debris. The uterine incision was repaired with 0 Vicryl in a running locked fashion.  A second layer of the same suture was used in an imbricating fashion to obtain excellent hemostasis.  The uterus was then returned to the abdomen, the gutters were cleared of all clots and debris. The uterine incision was reinspected and found to be hemostatic. The peritoneum was grasped and closed with 2-0 Vicryl in a running fashion. The cut muscle edges and the underside of the fascia were inspected and found to be hemostatic. The fascia was closed with 0 Vicryl in two halves. The subcutaneous tissue was irrigated. Scarpa's layer was closed with a 2-0 plain gut suture. The skin was closed with a 4-0 Monocryl in a single layer. The patient tolerated the procedure well. Sponge lap and needle counts were correct x3 and patient was taken to the recovery room in a stable condition.  Attallah Ontko A. 04/27/2011 8:54 AM

## 2011-04-27 NOTE — Transfer of Care (Signed)
Immediate Anesthesia Transfer of Care Note  Patient: Jacqueline Brooks  Procedure(s) Performed: Procedure(s) (LRB): CESAREAN SECTION (N/A)  Patient Location: PACU  Anesthesia Type: Spinal  Level of Consciousness: awake, alert  and oriented  Airway & Oxygen Therapy: Patient Spontanous Breathing  Post-op Assessment: Report given to PACU RN and Post -op Vital signs reviewed and stable  Post vital signs: stable  Complications: No apparent anesthesia complications

## 2011-04-27 NOTE — H&P (Signed)
Jacqueline Brooks is a 31 y.o. G2P1001 at [redacted]w[redacted]d presenting for RCS. Pt notes no contractions. Good fetal movement, No vaginal bleeding, not leaking fluid .  [facility] since 7 wks PN Issues: - low lying placenta, resolved at 28 wkis - prior c/s for CPD- baby 8'13 - h/o lap band, needed active management and unfill in preg - depression, stable on Prozac - obesity, nl early and nl DS  OB History    Grav Para Term Preterm Abortions TAB SAB Ect Mult Living   2 1 1       1      Past Medical History  Diagnosis Date  . PONV (postoperative nausea and vomiting)   . Depression   . Anxiety   . Heartburn in pregnancy     tx with tums prn  . H/O hiatal hernia     hx - was repaired with gastric banding 02/2008  . Headache     otc med prn - last migraine 1 yr ago   Past Surgical History  Procedure Date  . Breast reduction surgery 2003  . Liposuction 2003    abd/thighs  . Laparoscopic gastric banding December 2009  . Cesarean section 06/2009  . Tonsillectomy   . Tonsillectomy and adenoidectomy   . Wisdom tooth extraction    Family History: family history is not on file. Social History:  reports that she has never smoked. She has never used smokeless tobacco. She reports that she drinks alcohol. She reports that she does not use illicit drugs.  Review of Systems - Negative except      Blood pressure 124/84, pulse 96, temperature 98.2 F (36.8 C), temperature source Oral, resp. rate 18, last menstrual period 07/25/2010, SpO2 99.00%, unknown if currently breastfeeding.  Physical Exam:  Gen: well appearing, no distress  Back: no CVAT Abd: gravid, NT, no RUQ pain LE: tr edema, equal bilaterally, non-tender Active FH  Prenatal labs: ABO, Rh: --/--/O POS (02/15 0615) Antibody: NEG (02/15 0615) Rubella:   RPR: NON REACTIVE (02/04 0916)  HBsAg: Negative (07/18 0000)  HIV: Non-reactive (07/18 0000)  GBS:   neg 1 hr Glucola 118   Genetic screening nl quad Anatomy US  nl   Assessment/Plan: 31 y.o. G2P1001 at [redacted]w[redacted]d - prior c/s for CPD, planning RCS. R/B of surgery reviewed w/ pt  - h/o depression, cont Prozac 20mg /d -h/o Vit  D def, cont supplementation - needs Tdap PP   Jacqueline Yuan A. 04/27/2011, 7:31 AM

## 2011-04-27 NOTE — Anesthesia Postprocedure Evaluation (Signed)
  Anesthesia Post-op Note  Patient: Jacqueline Brooks  Procedure(s) Performed: Procedure(s) (LRB): CESAREAN SECTION (N/A)  Patient Location: PACU and Mother/Baby  Anesthesia Type: Spinal  Level of Consciousness: awake, alert , oriented and patient cooperative  Airway and Oxygen Therapy: Patient Spontanous Breathing  Post-op Pain: none  Post-op Assessment: Post-op Vital signs reviewed  Post-op Vital Signs: Reviewed and stable  Complications: No apparent anesthesia complications

## 2011-04-27 NOTE — Anesthesia Preprocedure Evaluation (Signed)
Anesthesia Evaluation  Patient identified by MRN, date of birth, ID band Patient awake    Reviewed: Allergy & Precautions, H&P , NPO status , Patient's Chart, lab work & pertinent test results, reviewed documented beta blocker date and time   History of Anesthesia Complications Negative for: history of anesthetic complications  Airway Mallampati: I TM Distance: >3 FB Neck ROM: full    Dental  (+)    Pulmonary neg pulmonary ROS,  clear to auscultation        Cardiovascular neg cardio ROS regular Normal    Neuro/Psych  Headaches, PSYCHIATRIC DISORDERS (depression, anxiety)    GI/Hepatic Neg liver ROS, hiatal hernia, GERD-  ,  Endo/Other  Negative Endocrine ROS  Renal/GU negative Renal ROS  Genitourinary negative   Musculoskeletal   Abdominal   Peds  Hematology negative hematology ROS (+)   Anesthesia Other Findings   Reproductive/Obstetrics (+) Pregnancy (h/o prior c/s x1)                           Anesthesia Physical Anesthesia Plan  ASA: II  Anesthesia Plan: Spinal   Post-op Pain Management:    Induction:   Airway Management Planned:   Additional Equipment:   Intra-op Plan:   Post-operative Plan:   Informed Consent: I have reviewed the patients History and Physical, chart, labs and discussed the procedure including the risks, benefits and alternatives for the proposed anesthesia with the patient or authorized representative who has indicated his/her understanding and acceptance.     Plan Discussed with: Surgeon and CRNA  Anesthesia Plan Comments:         Anesthesia Quick Evaluation

## 2011-04-28 LAB — CBC
HCT: 31.3 % — ABNORMAL LOW (ref 36.0–46.0)
Hemoglobin: 10.2 g/dL — ABNORMAL LOW (ref 12.0–15.0)
MCH: 30.4 pg (ref 26.0–34.0)
MCHC: 32.6 g/dL (ref 30.0–36.0)
MCV: 93.4 fL (ref 78.0–100.0)
Platelets: 184 10*3/uL (ref 150–400)
RBC: 3.35 MIL/uL — ABNORMAL LOW (ref 3.87–5.11)
RDW: 13.7 % (ref 11.5–15.5)
WBC: 13.5 10*3/uL — ABNORMAL HIGH (ref 4.0–10.5)

## 2011-04-28 LAB — CCBB MATERNAL DONOR DRAW

## 2011-04-28 NOTE — Progress Notes (Signed)
Subjective: POD# 1 Information for the patient's newborn:  Jacqueline Brooks, Jacqueline Brooks [[id number]]  female   Reports feeling well, OOB and showered, desires early D/C tomorrow. Feeding: breast and bottle Patient reports tolerating PO.  Breast symptoms: hx breast reduction Pain controlled with Motrin and Percocet Denies HA/SOB/C/P/N/V/dizziness. Flatus present. She reports vaginal bleeding as normal, clot x 1.  She is ambulating, urinating without difficult.     Objective:   VS:  Filed Vitals:   04/27/11 1745 04/27/11 1945 04/27/11 2345 04/28/11 0540  BP: 108/59 115/62 104/66 108/71  Pulse: 56 56 60 61  Temp: 97.9 F (36.6 C) 98.3 F (36.8 C) 98.4 F (36.9 C) 97.6 F (36.4 C)  TempSrc: Oral Oral Oral Oral  Resp: 20 20 20 20   Height:      Weight:      SpO2: 98% 94% 96% 97%     Intake/Output Summary (Last 24 hours) at 04/28/11 1002 Last data filed at 04/27/11 2225  Gross per 24 hour  Intake    540 ml  Output   2625 ml  Net  -2085 ml        Basename 04/28/11 0608  WBC 13.5*  HGB 10.2*  HCT 31.3*  PLT 184     Blood type: --/--/O POS (02/15 0615)  Rubella: Immune (07/18 0000)     Physical Exam:  General: alert, cooperative and no distress CV: Regular rate and rhythm Resp: clear Abdomen: soft, nontender, normal bowel sounds Incision: clean, dry, intact and [place] suture and dermabond intact, skin edges well aprox.  Uterine Fundus: firm, below umbilicus, nontender Lochia: minimal Ext: Homans sign is negative, no sign of DVT and no edema, redness or tenderness in the calves or thighs      Assessment/Plan: 31 y.o.  POD# 1.  s/p Cesarean Delivery.  Indications: scheduled repeat                Principal Problem:  *PP care - s/p  rpt C/S 2/15  Doing well, stable.               Advance diet as tolerated Ambulate Routine post-op care  Lactation support for hx breast reduction Anticipate discharge home in AM.   Jacqueline Brooks 04/28/2011, 10:02 AM

## 2011-04-29 ENCOUNTER — Encounter (HOSPITAL_COMMUNITY)
Admission: RE | Admit: 2011-04-29 | Discharge: 2011-04-29 | Disposition: A | Discharge status: Home / Self Care | Payer: BC Managed Care – PPO | Source: Ambulatory Visit | Attending: Obstetrics | Admitting: Obstetrics

## 2011-04-29 DIAGNOSIS — O923 Agalactia: Secondary | ICD-10-CM | POA: Insufficient documentation

## 2011-04-29 MED ORDER — IBUPROFEN 600 MG PO TABS: 600.0000 mg | ORAL_TABLET | Freq: Four times a day (QID) | ORAL | Status: AC

## 2011-04-29 MED ORDER — OXYCODONE-ACETAMINOPHEN 5-325 MG PO TABS: 1.0000 | ORAL_TABLET | ORAL | Status: AC | PRN

## 2011-04-29 MED ORDER — PRENATAL MULTIVITAMIN CH: 1.0000 | ORAL_TABLET | Freq: Every day | ORAL | Status: DC

## 2011-04-29 NOTE — Progress Notes (Signed)
Subjective: POD# 2 Information for the patient's newborn:  Jacqueline, Brooks [[id number]]  female    Reports feeling well.  Feeding: breast and bottle Patient reports tolerating PO.  Breast symptoms: pumping, small bleeding from L nipple, working with lactation / SIS Pain controlled with Motrin and Percocet Denies HA/SOB/C/P/N/V/dizziness. Flatus present, (+) BM x 3 yesterday, loose stool. She reports vaginal bleeding as normal, without clots.  She is ambulating, urinating without difficult.     Objective:   VS:  Filed Vitals:   04/28/11 1452 04/28/11 2142 04/29/11 0609 04/29/11 0820  BP: 108/64 108/70 111/64 103/52  Pulse: 61 56 76 63  Temp: 97.9 F (36.6 C) 98.2 F (36.8 C) 98.5 F (36.9 C) 99.1 F (37.3 C)  TempSrc: Oral Oral Oral Oral  Resp: 18 18 18 18   Height:      Weight:      SpO2:        No intake or output data in the 24 hours ending 04/29/11 0919      Basename 04/28/11 0608  WBC 13.5*  HGB 10.2*  HCT 31.3*  PLT 184     Blood type: --/--/O POS (02/15 0615)  Rubella: Immune (07/18 0000)     Physical Exam:  General: alert, cooperative and no distress CV: Regular rate and rhythm Resp: clear Abdomen: soft, nontender, normal bowel sounds Incision: serous and small drainage present and skin well approx w/ suture and dermabond, no erythema / echymosis Uterine Fundus: firm, below umbilicus, nontender Lochia: minimal Ext: edema none, neg Homan's      Assessment/Plan: 31 y.o.. POD# 2.  s/p Cesarean Delivery.  Indications: repeat                Principal Problem:  *PP care - s/p  rpt C/S 2/15  Doing well, stable.    D/C stool softeners D/C home w/ instructions.             Jacqueline Brooks 04/29/2011, 9:19 AM

## 2011-04-29 NOTE — Discharge Summary (Signed)
POSTOPERATIVE DISCHARGE SUMMARY:  Patient ID: Jacqueline Brooks MRN: [id number] DOB/AGE: [birth year] 31 y.o.  Admit date: 04/27/2011 Discharge date:  04/29/2011   Admission Diagnoses:  1. Term pregnancy 2. Previous cesarean section for repeat 3. Depression, stable on Prozac  Discharge Diagnoses:  1 .Term Pregnancy-delivered 2. Post-operative day 2, stable  Prenatal history: [id number]   EDC : 05/01/2011, by Last Menstrual Period  Prenatal care at [facility] since [redacted] weeks gestation, primary Dr. Ernestina Penna  Prenatal course complicated by: -low lying placenta, resolved at 28 wkis  - prior c/s for CPD- baby [phone]  - h/o lap band, needed active management and unfill in preg  - depression, stable on Prozac  - obesity, nl early and nl DS   Prenatal Labs: ABO, Rh: O (07/18 0000)  Antibody: NEG (02/15 0615) Rubella: Immune (07/18 0000)  RPR: NON REACTIVE (02/04 0916)  HBsAg: Negative (07/18 0000)  HIV: Non-reactive (07/18 0000)  GBS:   negative 1 hr Glucola : normal   Medical / Surgical History :  Past medical history:  Past Medical History  Diagnosis Date  . PONV (postoperative nausea and vomiting)   . Depression   . Anxiety   . Heartburn in pregnancy     tx with tums prn  . H/O hiatal hernia     hx - was repaired with gastric banding 02/2008  . Headache     otc med prn - last migraine 1 yr ago  . PP care - s/p  rpt C/S 2/15 04/27/2011    Past surgical history:  Past Surgical History  Procedure Date  . Breast reduction surgery 2003  . Liposuction 2003    abd/thighs  . Laparoscopic gastric banding December 2009  . Cesarean section 06/2009  . Tonsillectomy   . Tonsillectomy and adenoidectomy   . Wisdom tooth extraction     Family History: History reviewed. No pertinent family history.  Social History:  reports that she has never smoked. She has never used smokeless tobacco. She reports that she drinks alcohol. She reports that she does not use illicit  drugs.   Allergies: Sulfa antibiotics    Current Medications at time of admission:  Prescriptions prior to admission  Medication Sig Dispense Refill  . cholecalciferol (VITAMIN D) 1000 UNITS tablet Take 2,000 Units by mouth daily.      Marland Kitchen FLUoxetine (PROZAC) 20 MG capsule Take 20 mg by mouth daily.      Marland Kitchen l-methylfolate-B6-B12 (METANX) 3-35-2 MG TABS Take 1 tablet by mouth daily.      Marland Kitchen zolpidem (AMBIEN) 10 MG tablet Take 10 mg by mouth at bedtime as needed.      Marland Kitchen DISCONTD: calcium carbonate (TUMS - DOSED IN MG ELEMENTAL CALCIUM) 500 MG chewable tablet Chew 2 tablets by mouth daily.           Intrapartum Course: N/A  Procedures: Cesarean section delivery of female newborn by Dr Ernestina Penna  See operative report for further details  Postoperative / postpartum course:  uneventful  Physical Exam:  VSS: Blood pressure 103/52, pulse 63, temperature 99.1 F (37.3 C), temperature source Oral, resp. rate 18, height 5\' 4"  (1.626 m), weight 226 lb (102.513 kg), last menstrual period 07/25/2010, SpO2 97.00%, unknown if currently breastfeeding.  Admit labs: Wbc 13.4, hgb 12.1, hct 37.0, plts 207  Post-op labs:  LABS:  Lab Results  Component Value Date   WBC 13.5* 04/28/2011   HGB 10.2* 04/28/2011   HCT 31.3* 04/28/2011   MCV 93.4  04/28/2011   PLT 184 04/28/2011     I&O: I/O last 3 completed shifts: In: -  Out: 2100 [Urine:2100]      Incision:  approximated with sutures and dermabond / no erythema / no ecchymosis / trace drainage   Discharge Instructions:  Discharged Condition: good Activity: pelvic rest and weight lifting restrictions x 2 weeks Diet: routine Medications:  Medication List  As of 04/29/2011  9:26 AM   STOP taking these medications         calcium carbonate 500 MG chewable tablet         TAKE these medications         cholecalciferol 1000 UNITS tablet   Commonly known as: VITAMIN D   Take 2,000 Units by mouth daily.      FLUoxetine 20 MG capsule    Commonly known as: PROZAC   Take 20 mg by mouth daily.      ibuprofen 600 MG tablet   Commonly known as: ADVIL,MOTRIN   Take 1 tablet (600 mg total) by mouth every 6 (six) hours.      l-methylfolate-B6-B12 3-35-2 MG Tabs   Commonly known as: METANX   Take 1 tablet by mouth daily.      oxyCODONE-acetaminophen 5-325 MG per tablet   Commonly known as: PERCOCET   Take 1-2 tablets by mouth every 3 (three) hours as needed (moderate - severe pain).      prenatal multivitamin Tabs   Take 1 tablet by mouth daily.      zolpidem 10 MG tablet   Commonly known as: AMBIEN   Take 10 mg by mouth at bedtime as needed.           Condition: stable Postpartum Instructions: refer to practice specific booklet Discharge to: home Disposition: discharge home Follow up :  Follow-up Information    Follow up with [facility] MD. Schedule an appointment as soon as possible for a visit in 6 weeks.   Contact information:   [address] [place] [address] [phone]           Signed: Arlan Organ 04/29/2011, 9:26 AM

## 2011-04-29 NOTE — Progress Notes (Signed)
PSYCHOSOCIAL ASSESSMENT ~ MATERNAL/CHILD Name:  [place] Age 32 days Referral Date 04/28/11 Reason/Source History of Anxiety and Depression  I. FAMILY/HOME ENVIRONMENT A. Child's Legal Guardian  Parent(s) X  Jacqueline Brooks parent    DSS  Name  Jacqueline Brooks DOB January 17, 1981 Age 31 y/o Address [address] [place], [place]  [address]  Name  Jacqueline Brooks DOB [birth year] Age 31 y/o  Address Same as above Other Household Members/Support Persons Name  Jacqueline Brooks Relationship Sibling DOB 63 months old        Name                   Relationship                   DOB        Name                   Relationship                   DOB                   Name                   Relationship                   DOB C.   Other Support - Grandparents and extended family  II. PSYCHOSOCIAL DATA A. Information Source  Patient Interview X  Family Interview           Other B. Event organiser  Employment  Not currently  [employer]    BCBS                              Self Pay   Food Stamps      WIC  Work Scientist, physiological Housing      Section 8     Maternity Care Coordination/Child Service Coordination/Early Intervention    School  Grade      Other Cultural and Environment Information Cultural Issues Impacting Care  III. STRENGTHS  Supportive family/friends Yes  Adequate Resources  Yes Compliance with medical plan Yes  Home prepared for Child (including basic supplies) Yes Understanding of illness           Other  IV. RISK FACTORS AND CURRENT PROBLEMS  V. No Problems Note   VI.  Substance Abuse                                          Pt Family             Mental Illness     Pt Family               Family/Relationship Issues   Pt Family      Abuse/Neglect/Domestic Violence   Pt Family   Financial Resources     Pt Family  Transportation     Pt Family  DSS Involvement    Pt Family  Adjustment to  Illness    Pt Family   Knowledge/Cognitive Deficit   Pt Family   Compliance with Treatment   Pt Family   Basic Needs (  food, housing, etc)  Pt Family  Housing Concerns    Pt Family  Other             VII. SOCIAL WORK ASSESSMENT SW received referral due to MOB's history of anxiety and depression.  MOB stated her symptoms presented themselves after her first child was born 22 months ago.  She reports she is currently being treated with 20 mg of Prozac which have relieved her symptoms.  MOB reports being able to identify her symptoms and is capable of obtaining assistance as needed.  She indicated there were life stressors that precipitated her previous anxiety.  MOB was breast feeding baby and appeared to be bonding appropriately.  She acknowledged there being a transitional period when she returns home to her son.  Family has private insurance.  No supply needs were stated.  SW provided MOB with [facility] [employer].  VIII. SOCIAL WORK PLAN (In [place]) No Further Intervention Required/No Barriers to Discharge Psychosocial Support and Ongoing Assessment of Needs Patient/Family  Education Child Protective Services Report  [place]        Date Information/Referral to [facility]   Other

## 2011-04-30 ENCOUNTER — Encounter (HOSPITAL_COMMUNITY): Payer: Self-pay | Admitting: Obstetrics

## 2011-05-04 ENCOUNTER — Encounter (INDEPENDENT_AMBULATORY_CARE_PROVIDER_SITE_OTHER): Payer: Self-pay

## 2011-05-04 ENCOUNTER — Ambulatory Visit (INDEPENDENT_AMBULATORY_CARE_PROVIDER_SITE_OTHER): Payer: BC Managed Care – PPO | Admitting: Physician Assistant

## 2011-05-04 VITALS — BP 120/82 | HR 68 | Temp 97.9°F | Resp 18 | Ht 64.0 in | Wt 216.4 lb

## 2011-05-04 DIAGNOSIS — Z4651 Encounter for fitting and adjustment of gastric lap band: Secondary | ICD-10-CM

## 2011-05-04 NOTE — Progress Notes (Signed)
  HISTORY: Jacqueline Brooks is a 31 y.o.female who received an AP-Standard lap-band in December 2009 by Dr. Ezzard Standing. She just delivered her daughter by c-section one week ago and is ready to get back on track with her lap-band. I removed all her fluid at the beginning of this month in anticipation of delivery.  VITAL SIGNS: Filed Vitals:   05/04/11 0850  BP: 120/82  Pulse: 68  Temp: 97.9 F (36.6 C)  Resp: 18    PHYSICAL EXAM: Physical exam reveals a very well-appearing 31 y.o.female in no apparent distress Neurologic: Awake, alert, oriented Psych: Bright affect, conversant Respiratory: Breathing even and unlabored. No stridor or wheezing Abdomen: Soft, nontender, nondistended to palpation. Incisions well-healed. No incisional hernias. Port easily palpated. Extremities: Atraumatic, good range of motion.  ASSESMENT: 31 y.o.  female  s/p AP-Standard lap-band.   PLAN: The patient's port was accessed with a 20G Huber needle without difficulty. Clear fluid was aspirated and 5 mL saline was added to the port to give a total predicted volume of 5 mL. The patient was able to swallow water without difficulty following the procedure and was instructed to take clear liquids for the next 24-48 hours and advance slowly as tolerated.

## 2011-05-04 NOTE — Patient Instructions (Signed)
Take clear liquids for the next 48 hours. Thin protein shakes are ok to start on Saturday evening. You may begin foods with the consistency of yogurt, cottage cheese, cream soups, etc. on Sunday. Call us if you have persistent vomiting or regurgitation, night cough or reflux symptoms. Return as scheduled or sooner if you notice no changes in hunger/portion sizes.   

## 2011-05-29 ENCOUNTER — Encounter (HOSPITAL_COMMUNITY)
Admission: RE | Admit: 2011-05-29 | Discharge: 2011-05-29 | Disposition: A | Discharge status: Home / Self Care | Payer: BC Managed Care – PPO | Source: Ambulatory Visit | Attending: Obstetrics | Admitting: Obstetrics

## 2011-05-29 DIAGNOSIS — O923 Agalactia: Secondary | ICD-10-CM | POA: Insufficient documentation

## 2011-06-28 ENCOUNTER — Encounter (INDEPENDENT_AMBULATORY_CARE_PROVIDER_SITE_OTHER): Payer: BC Managed Care – PPO

## 2011-06-29 ENCOUNTER — Encounter (HOSPITAL_COMMUNITY)
Admission: RE | Admit: 2011-06-29 | Discharge: 2011-06-29 | Disposition: A | Discharge status: Home / Self Care | Payer: BC Managed Care – PPO | Source: Ambulatory Visit | Attending: Obstetrics | Admitting: Obstetrics

## 2011-06-29 DIAGNOSIS — O923 Agalactia: Secondary | ICD-10-CM | POA: Insufficient documentation

## 2011-07-12 ENCOUNTER — Encounter (INDEPENDENT_AMBULATORY_CARE_PROVIDER_SITE_OTHER): Payer: Self-pay

## 2011-07-12 ENCOUNTER — Ambulatory Visit (INDEPENDENT_AMBULATORY_CARE_PROVIDER_SITE_OTHER): Payer: BC Managed Care – PPO | Admitting: Physician Assistant

## 2011-07-12 NOTE — Patient Instructions (Signed)
Return in 2 months or sooner if needed. 

## 2011-07-12 NOTE — Progress Notes (Signed)
  HISTORY: Jacqueline Brooks is a 31 y.o.female who received an AP-Standard lap-band in December 2009 by Dr. Ezzard Standing. She now has 5 mL in her band as she recently delivered her daughter. She denies hunger and is continuing to lose weight. She and her husband are engaged in a 5+2 diet where 2 days of the week they take 500 calories or so. She feels no hunger with this and it helps her overall energy level. She's exercising regularly.  VITAL SIGNS: Filed Vitals:   07/12/11 1223  BP: 112/80  Pulse: 80  Temp: 98.7 F (37.1 C)  Resp: 12    PHYSICAL EXAM: Physical exam reveals a very well-appearing 31 y.o.female in no apparent distress Neurologic: Awake, alert, oriented Psych: Bright affect, conversant Respiratory: Breathing even and unlabored. No stridor or wheezing Extremities: Atraumatic, good range of motion. Skin: Warm, Dry, no rashes Musculoskeletal: Normal gait, Joints normal  ASSESMENT: 31 y.o.  female  s/p AP-standard lap-band.   PLAN: We deferred an adjustment today as she's in the green zone. We'll have her back in 2 months or sooner if needed.

## 2011-09-06 ENCOUNTER — Encounter (INDEPENDENT_AMBULATORY_CARE_PROVIDER_SITE_OTHER): Payer: BC Managed Care – PPO

## 2011-10-04 ENCOUNTER — Encounter (INDEPENDENT_AMBULATORY_CARE_PROVIDER_SITE_OTHER): Payer: Self-pay

## 2011-10-04 ENCOUNTER — Ambulatory Visit (INDEPENDENT_AMBULATORY_CARE_PROVIDER_SITE_OTHER): Payer: BC Managed Care – PPO | Admitting: Physician Assistant

## 2011-10-04 VITALS — BP 122/88 | HR 64 | Temp 97.6°F | Resp 16 | Ht 64.0 in | Wt 224.6 lb

## 2011-10-04 DIAGNOSIS — Z4651 Encounter for fitting and adjustment of gastric lap band: Secondary | ICD-10-CM

## 2011-10-04 NOTE — Patient Instructions (Signed)
Take clear liquids tonight. Thin protein shakes are ok to start tomorrow morning. Slowly advance your diet thereafter. Call us if you have persistent vomiting or regurgitation, night cough or reflux symptoms. Return as scheduled or sooner if you notice no changes in hunger/portion sizes.  

## 2011-10-04 NOTE — Progress Notes (Signed)
  HISTORY: Jacqueline Brooks is a 31 y.o.female who received an AP-Standard lap-band in December 2009 by Dr. Ezzard Standing. She comes in with persistent hunger and large portion sizes. No regurgitation or reflux.  VITAL SIGNS: Filed Vitals:   10/04/11 1152  BP: 122/88  Pulse: 64  Temp: 97.6 F (36.4 C)  Resp: 16    PHYSICAL EXAM: Physical exam reveals a very well-appearing 31 y.o.female in no apparent distress Neurologic: Awake, alert, oriented Psych: Bright affect, conversant Respiratory: Breathing even and unlabored. No stridor or wheezing Abdomen: Soft, nontender, nondistended to palpation. Incisions well-healed. No incisional hernias. Port easily palpated. Extremities: Atraumatic, good range of motion.  ASSESMENT: 31 y.o.  female  s/p AP-Standard lap-band.   PLAN: The patient's port was accessed with a 20G Huber needle without difficulty. Clear fluid was aspirated and 0.5 mL saline was added to the port to give a total predicted volume of 5.5 mL. The patient was able to swallow water without difficulty following the procedure and was instructed to take clear liquids for the next 24-48 hours and advance slowly as tolerated.

## 2012-01-03 ENCOUNTER — Ambulatory Visit (INDEPENDENT_AMBULATORY_CARE_PROVIDER_SITE_OTHER): Payer: BC Managed Care – PPO | Admitting: Physician Assistant

## 2012-01-03 ENCOUNTER — Encounter (INDEPENDENT_AMBULATORY_CARE_PROVIDER_SITE_OTHER): Payer: Self-pay

## 2012-01-03 ENCOUNTER — Other Ambulatory Visit (INDEPENDENT_AMBULATORY_CARE_PROVIDER_SITE_OTHER): Payer: Self-pay | Admitting: Physician Assistant

## 2012-01-03 DIAGNOSIS — Z9884 Bariatric surgery status: Secondary | ICD-10-CM

## 2012-01-03 NOTE — Progress Notes (Signed)
  HISTORY: KATLYNNE GRANNEMAN is a 31 y.o.female who received an AP-Standard lap-band in December 2009 by Dr. Ezzard Standing. She comes in with 5 lbs of weight loss since her last visit three months ago. She describes small portion sizes which satisfy her hunger. She denies the urge to go back for seconds. She says normally her hunger is under very good control. She had engaged in a crossfit program but the schedule is difficult to follow and the exercises are difficult for her to tolerate. She had many questions regarding carbohydrates, nutrition and exercise.  VITAL SIGNS: Filed Vitals:   01/03/12 0910  BP: 96/58  Pulse: 77    PHYSICAL EXAM: Physical exam reveals a very well-appearing 31 y.o.female in no apparent distress Neurologic: Awake, alert, oriented Psych: Bright affect, conversant Respiratory: Breathing even and unlabored. No stridor or wheezing Extremities: Atraumatic, good range of motion. Skin: Warm, Dry, no rashes Musculoskeletal: Normal gait, Joints normal  ASSESMENT: 31 y.o.  female  s/p AP-Standard lap-band.   PLAN: We opted to not do an adjustment today as she appears to be in the green zone. I recommended visiting Huntley Dec in nutrition which she seems eager to do. We talked about exercise strategies, namely low impact to start. We'll have her return in three months unless she notices issues with the band in the meantime.

## 2012-01-03 NOTE — Patient Instructions (Signed)
Return in three months. Focus on good food choices as well as physical activity. Return sooner if you have an increase in hunger, portion sizes or weight. Return also for difficulty swallowing, night cough, reflux.   

## 2012-01-24 ENCOUNTER — Encounter: Payer: Self-pay | Admitting: *Deleted

## 2012-01-24 ENCOUNTER — Encounter: Payer: BC Managed Care – PPO | Attending: Surgery | Admitting: *Deleted

## 2012-01-24 VITALS — Ht 64.0 in | Wt 220.0 lb

## 2012-01-24 DIAGNOSIS — E669 Obesity, unspecified: Secondary | ICD-10-CM

## 2012-01-24 DIAGNOSIS — Z09 Encounter for follow-up examination after completed treatment for conditions other than malignant neoplasm: Secondary | ICD-10-CM | POA: Insufficient documentation

## 2012-01-24 DIAGNOSIS — Z9884 Bariatric surgery status: Secondary | ICD-10-CM | POA: Insufficient documentation

## 2012-01-24 DIAGNOSIS — Z713 Dietary counseling and surveillance: Secondary | ICD-10-CM | POA: Insufficient documentation

## 2012-01-24 NOTE — Patient Instructions (Addendum)
Goals:  Follow the Bariatric Surgery Specialized Post-Op Meal Plan  Increase lean protein foods to meet 60-80g goal  Limit carb intake to 15 grams per meal/snack - (no more than 50-75g per day)  Avoid drinking 15 minutes before, during and 30 minutes after eating  Aim for >30 min of physical activity daily  Resume daily supplementation: 1 complete MVI and 1200-1500 mg calcium citrate (split in 2-3 doses over the day)  Limit "cheat" days to 1 per week

## 2012-01-24 NOTE — Progress Notes (Signed)
  Follow-up visit:  4+ Years Post-Operative LAGB Surgery  Medical Nutrition Therapy:  Appt start time: 0900 end time:  1015.  Assessment/Primary concerns today:  Post-operative Bariatric Surgery Nutrition Management. Abia comes today 4 yrs s/p LAGB for nutrition refresher. Desires to lose 50 lbs to reach goal weight of ~165-170. Lowest weight was 198 lbs 1.5 yrs ago before she became pregnant with her now 54 mo old daughter.  Juliya requests a specific plan to follow to get her back on track. Previously following a low CHO, high fat "paleo" type diet and states she lost wt. Has not been following a plan since start of Oct and exercise has been minimal d/t work travel. Very eager to do "whatever it takes".  Great visit!!  TANITA  BODY COMP RESULTS  01/24/12   %Fat 46.7%   Fat Mass (lbs) 102.5   Fat Free Mass (lbs) 117.5   Total Body Water (lbs) 86.0   24-hr recall: B (AM): Coffee, crustless quiche made w/ bacon, cheese, eggs (1/2c) Snk (AM): None  L (PM): Grilled chicken breast w/ melted cheese, bacon, and ranch (3-4 oz) Snk (PM): 1 oz JIF chocolate PB cup  D (PM):  Pulled pork in crock pot w/ sour cream, cheddar cheese, and salsa Snk (PM): Ghiradelli dark chocolate 1 oz, [employer] popcorn (12 g CHO), string cheese, OR 1-2 oz nuts  Fluid intake:  ~ 90 oz Estimated total protein intake: 65-80 g  Medications: Prozac, Augmentin, Benzonatate; Xanax (prn) Supplementation:  Not taking MVI, Calcium citrate, B complex, or Vit D  Using straws: No Drinking while eating:  Not usually Hair loss: No Carbonated beverages: Yes; 12 oz diet Mtn Dew or Pepsi Max N/V/D/C:  Regurgitation only when eating too quickly Last Lap-Band fill:  10/04/11; 0.5 mL added - all 5.5 cc of fluid removed on  04/13/11 before birth of daughter; 5 cc replaced on 05/04/11  Recent physical activity:  30-35 min @ 2-3 times/week; walk on weekends (3 miles pushing stroller) - not consistent at this time, though states this is  her goal  Progress Towards Goal(s):  In progress.  Handouts given during visit include:  Bariatric Surgery Specialized Post-Op Diet  Bariatric Surgery Fast Food Guide   Nutritional Diagnosis:  [place]-3.3 Obesity related to pregnancy and lapband fluid removal as evidenced by unintentional weight gain of 22 lbs s/p LAGB .    Intervention:  Nutrition education.  Monitoring/Evaluation:  Dietary intake, exercise, lap band fills, and body weight. Follow up in 1 month or prn.

## 2012-02-21 ENCOUNTER — Ambulatory Visit: Payer: BC Managed Care – PPO | Admitting: *Deleted

## 2012-03-25 ENCOUNTER — Encounter: Payer: BC Managed Care – PPO | Attending: Surgery | Admitting: *Deleted

## 2012-03-25 ENCOUNTER — Encounter: Payer: Self-pay | Admitting: *Deleted

## 2012-03-25 VITALS — Ht 64.0 in | Wt 223.0 lb

## 2012-03-25 DIAGNOSIS — E669 Obesity, unspecified: Secondary | ICD-10-CM

## 2012-03-25 DIAGNOSIS — Z9884 Bariatric surgery status: Secondary | ICD-10-CM | POA: Insufficient documentation

## 2012-03-25 DIAGNOSIS — Z713 Dietary counseling and surveillance: Secondary | ICD-10-CM | POA: Insufficient documentation

## 2012-03-25 DIAGNOSIS — Z09 Encounter for follow-up examination after completed treatment for conditions other than malignant neoplasm: Secondary | ICD-10-CM | POA: Insufficient documentation

## 2012-03-25 NOTE — Patient Instructions (Addendum)
Goals:  Follow the Bariatric Surgery Specialized Post-Op Meal Plan  Increase lean protein foods to meet 60-80g goal  Limit carb intake to 15 grams per meal or snack - 3 meals/day and 1-3 snacks/day  Avoid drinking 15 minutes before, during and 30 minutes after eating  Aim for >30 min of physical activity daily  Resume daily supplementation: 1 complete MVI and 1200-1500 mg calcium citrate (split in 2-3 doses over the day)  Limit "cheat" days to 1 per week

## 2012-03-25 NOTE — Progress Notes (Signed)
  Follow-up visit:  4+ Years Post-Operative LAGB Surgery  Medical Nutrition Therapy:  Appt start time:  8:00  End time:  8:20.  Assessment/Primary concerns today:  Post-operative Bariatric Surgery Nutrition Management. Jacqueline Brooks comes today and reports initially doing well after last visit. Lost 8 lbs, then "fell off the wagon" during holidays and gained back. Restarting plan today and will return in 1 month. Plans to start using her new gym membership while training for a Couch to 5K. Still not taking supplements.    Surgery date: 02/16/08 Surgery type: LAGB Lowest weight: 198 lbs (1.5 yrs ago) Last weight: 220 lbs (01/24/12)  Weight today: 223.0 lbs Weight change: 3.0 lb (GAIN) Total weight lost: 0 lbs  Goal weight: 165-170 lbs % goal met: 0%  TANITA  BODY COMP RESULTS  01/24/12 03/26/12   %Fat 37.7 38.3   Fat Mass (lbs) 102.5 106.0   Fat Free Mass (lbs) 117.5 117.0   Total Body Water (lbs) 86.0 85.5   24-hr recall:  Reports doing well with intake after last visit. Lost down to 212 lbs, then gained back over holidays.   Fluid intake:  ~ 90 oz Estimated total protein intake: 65-80 g  Medications:  No longer taking Benzonatate Supplementation:  Not taking MVI, Calcium citrate  Using straws: No Drinking while eating:  Yes Hair loss: No Carbonated beverages: Yes; 12 oz diet Mtn Dew or Pepsi Max N/V/D/C:  Regurgitation only when eating too quickly or too much Last Lap-Band fill:  10/04/11; 0.5 mL added - all 5.5 cc of fluid removed on  04/13/11 before birth of daughter; 5 cc replaced on 05/04/11  Recent physical activity:  Got gym membership to [employer] at [employer]; Starting [place] to 5K  Progress Towards Goal(s):  In progress.  Handouts given during visit include:  [facility] Price List   Nutritional Diagnosis:  [place]-3.3 Obesity related to recent pregnancy and lapband fluid removal as evidenced by unintentional weight gain of 22 lbs s/p LAGB .    Intervention:   Nutrition education.  Monitoring/Evaluation:  Dietary intake, exercise, lap band fills, and body weight. Follow up in 1 month.

## 2012-04-03 ENCOUNTER — Ambulatory Visit (INDEPENDENT_AMBULATORY_CARE_PROVIDER_SITE_OTHER): Payer: BC Managed Care – PPO | Admitting: Physician Assistant

## 2012-04-03 ENCOUNTER — Encounter (INDEPENDENT_AMBULATORY_CARE_PROVIDER_SITE_OTHER): Payer: Self-pay

## 2012-04-03 VITALS — BP 112/78 | HR 73 | Temp 97.5°F | Ht 64.0 in | Wt 221.6 lb

## 2012-04-03 DIAGNOSIS — Z4651 Encounter for fitting and adjustment of gastric lap band: Secondary | ICD-10-CM

## 2012-04-03 NOTE — Progress Notes (Signed)
  HISTORY: STEPANIE GRAVER is a 32 y.o.female who received an AP-Standard lap-band in December 2009 by Dr. Ezzard Standing. She comes in with slight increase in weight but no untoward symptoms. She is engaged in a couch to [employer] as well as working out at [employer]. She's seen Huntley Dec in nutrition who has established a program for her. She denies persistent regurgitation or reflux. She's had slight increase in her hunger and portion sizes and she believes a small adjustment would be of some benefit.  VITAL SIGNS: Filed Vitals:   04/03/12 0910  BP: 112/78  Pulse: 73  Temp: 97.5 F (36.4 C)    PHYSICAL EXAM: Physical exam reveals a very well-appearing 32 y.o.female in no apparent distress Neurologic: Awake, alert, oriented Psych: Bright affect, conversant Respiratory: Breathing even and unlabored. No stridor or wheezing Abdomen: Soft, nontender, nondistended to palpation. Incisions well-healed. No incisional hernias. Port easily palpated. Extremities: Atraumatic, good range of motion.  ASSESMENT: 32 y.o.  female  s/p AP-Standard lap-band.   PLAN: The patient's port was accessed with a 20G Huber needle without difficulty. Clear fluid was aspirated and 0.25 mL saline was added to the port to give a total predicted volume of 5.75 mL. The patient was able to swallow water without difficulty following the procedure and was instructed to take clear liquids for the next 24-48 hours and advance slowly as tolerated.

## 2012-04-03 NOTE — Patient Instructions (Signed)
Take clear liquids tonight. Thin protein shakes are ok to start tomorrow morning. Slowly advance your diet thereafter. Call us if you have persistent vomiting or regurgitation, night cough or reflux symptoms. Return as scheduled or sooner if you notice no changes in hunger/portion sizes.  

## 2012-04-21 ENCOUNTER — Ambulatory Visit: Payer: BC Managed Care – PPO | Admitting: *Deleted

## 2012-10-02 ENCOUNTER — Encounter (INDEPENDENT_AMBULATORY_CARE_PROVIDER_SITE_OTHER): Payer: Self-pay

## 2012-10-02 ENCOUNTER — Ambulatory Visit (INDEPENDENT_AMBULATORY_CARE_PROVIDER_SITE_OTHER): Payer: BC Managed Care – PPO | Admitting: Physician Assistant

## 2012-10-02 VITALS — BP 118/68 | HR 64 | Resp 14 | Ht 64.0 in | Wt 212.0 lb

## 2012-10-02 DIAGNOSIS — Z9884 Bariatric surgery status: Secondary | ICD-10-CM

## 2012-10-02 NOTE — Progress Notes (Signed)
  HISTORY: Jacqueline Brooks is a 32 y.o.female who received an AP-Standard lap-band in December 2009 by Dr. Ezzard Standing. She comes in with 9 lbs weight loss since her last visit in late January. She denies regurgitation or reflux. She reports occasional hunger, normally in the afternoon. She doesn't report eating more than small portions. She recounts having lost 20 lbs over lent for giving up sweets. She has discontinued sweets now and hopes to lose more weight.  VITAL SIGNS: Filed Vitals:   10/02/12 0911  BP: 118/68  Pulse: 64  Resp: 14    PHYSICAL EXAM: Physical exam reveals a very well-appearing 32 y.o.female in no apparent distress Neurologic: Awake, alert, oriented Psych: Bright affect, conversant Respiratory: Breathing even and unlabored. No stridor or wheezing Extremities: Atraumatic, good range of motion. Skin: Warm, Dry, no rashes Musculoskeletal: Normal gait, Joints normal  ASSESMENT: 32 y.o.  female  s/p AP-Standard lap-band.   PLAN: We discussed a small fill vs. Watching things. She would like to hold off on a fill today and work on reducing sweets and increasing physical activity. If these measures aren't successful, she will call for an appointment before her next scheduled visit in late September.

## 2012-10-02 NOTE — Patient Instructions (Signed)
Return in two months. Focus on good food choices as well as physical activity. Return sooner if you have an increase in hunger, portion sizes or weight. Return also for difficulty swallowing, night cough, reflux.   

## 2012-10-19 ENCOUNTER — Telehealth (INDEPENDENT_AMBULATORY_CARE_PROVIDER_SITE_OTHER): Payer: Self-pay | Admitting: General Surgery

## 2012-10-19 NOTE — Telephone Encounter (Signed)
She contacted me through a mutual friend about vomiting blood today after having some potato chips get stuck in her band.  She says that she recently returned from a trip to [place] and since then the band has felt tighter.  She has been doing okay with her weight loss but recently things have been getting stuck more frequently.  She felt the chip get stuck and when she vomited it up, she had some streaks of blood.  It has stopped now and she feels okay without any abdominal pain or fevers.  I recommended that if this continues or worsens to call us back but regardless, she should call the office tomorrow to set up an UGI for further evaluation of her band.  She may also need to come in again sooner to remove some of the fluid if she continues to have frequent vomiting or food getting stuck.  She will call the office tomorrow to try and set up UGI and to schedule follow up visit.

## 2012-12-04 ENCOUNTER — Encounter (INDEPENDENT_AMBULATORY_CARE_PROVIDER_SITE_OTHER): Payer: Self-pay

## 2012-12-04 ENCOUNTER — Ambulatory Visit (INDEPENDENT_AMBULATORY_CARE_PROVIDER_SITE_OTHER): Payer: BC Managed Care – PPO | Admitting: Physician Assistant

## 2012-12-04 VITALS — BP 126/74 | HR 65 | Temp 98.0°F | Resp 18 | Ht 64.0 in | Wt 218.4 lb

## 2012-12-04 DIAGNOSIS — Z4651 Encounter for fitting and adjustment of gastric lap band: Secondary | ICD-10-CM

## 2012-12-04 NOTE — Progress Notes (Signed)
  HISTORY: Jacqueline Brooks is a 32 y.o.female who received an AP-Standard lap-band in December 2009 by Dr. Ezzard Standing. She is going on a cruise this coming week and is concerned about becoming obstructed while out of town with no access to band adjustment. She would like some fluid removed. She has no symptoms of obstruction at this point.  VITAL SIGNS: Filed Vitals:   12/04/12 1014  BP: 126/74  Pulse: 65  Temp: 98 F (36.7 C)  Resp: 18    PHYSICAL EXAM: Physical exam reveals a very well-appearing 32 y.o.female in no apparent distress Neurologic: Awake, alert, oriented Psych: Bright affect, conversant Respiratory: Breathing even and unlabored. No stridor or wheezing Abdomen: Soft, nontender, nondistended to palpation. Incisions well-healed. No incisional hernias. Port easily palpated. Extremities: Atraumatic, good range of motion.  ASSESMENT: 32 y.o.  female  s/p AP-Standard lap-band.   PLAN: The patient's port was accessed with a 20G Huber needle without difficulty. Clear fluid was aspirated and 2.25 mL saline was removed from the port to give a total predicted volume of 3.5 mL. The patient was advised to concentrate on healthy food choices and to avoid slider foods high in fats and carbohydrates.

## 2012-12-04 NOTE — Patient Instructions (Signed)
Return in two weeks. Focus on good food choices as well as physical activity. Return sooner if you have an increase in hunger, portion sizes or weight. Return also for difficulty swallowing, night cough, reflux.   

## 2012-12-18 ENCOUNTER — Encounter (INDEPENDENT_AMBULATORY_CARE_PROVIDER_SITE_OTHER): Payer: Self-pay

## 2012-12-18 ENCOUNTER — Ambulatory Visit (INDEPENDENT_AMBULATORY_CARE_PROVIDER_SITE_OTHER): Payer: BC Managed Care – PPO | Admitting: Physician Assistant

## 2012-12-18 VITALS — BP 122/70 | HR 68 | Resp 14 | Ht 64.0 in | Wt 219.2 lb

## 2012-12-18 DIAGNOSIS — Z4651 Encounter for fitting and adjustment of gastric lap band: Secondary | ICD-10-CM

## 2012-12-18 NOTE — Patient Instructions (Signed)

## 2012-12-18 NOTE — Progress Notes (Signed)
  HISTORY: Jacqueline Brooks is a 32 y.o.female who received an AP-Standard lap-band in December 2009 by Dr. Ezzard Standing. She has just returned from international travel and fortunately did not gain much weight at today's measurement. She did say she gained 10 lbs but since returning a week ago has given up wheat. She has lost almost the entire amount gained. She reports having had almost daily regurgitation prior to having the fluid removed and she doesn't want to return to that state. She is eating a bit more than she probably should but her hunger is relatively well-controlled.  VITAL SIGNS: Filed Vitals:   12/18/12 1333  BP: 122/70  Pulse: 68  Resp: 14    PHYSICAL EXAM: Physical exam reveals a very well-appearing 32 y.o.female in no apparent distress Neurologic: Awake, alert, oriented Psych: Bright affect, conversant Respiratory: Breathing even and unlabored. No stridor or wheezing Abdomen: Soft, nontender, nondistended to palpation. Incisions well-healed. No incisional hernias. Port easily palpated. Extremities: Atraumatic, good range of motion.  ASSESMENT: 32 y.o.  female  s/p AP-Standard lap-band.   PLAN: The patient's port was accessed with a 20G Huber needle without difficulty. Clear fluid was aspirated and 1 mL saline was added to the port to give a total predicted volume of 4.5 mL. The patient was able to swallow water without difficulty following the procedure and was instructed to take clear liquids for the next 24-48 hours and advance slowly as tolerated.

## 2013-01-29 ENCOUNTER — Encounter (INDEPENDENT_AMBULATORY_CARE_PROVIDER_SITE_OTHER): Payer: Self-pay

## 2013-01-29 ENCOUNTER — Ambulatory Visit (INDEPENDENT_AMBULATORY_CARE_PROVIDER_SITE_OTHER): Payer: BC Managed Care – PPO | Admitting: Physician Assistant

## 2013-01-29 VITALS — BP 118/70 | HR 66 | Resp 14 | Ht 64.0 in | Wt 229.2 lb

## 2013-01-29 DIAGNOSIS — Z4651 Encounter for fitting and adjustment of gastric lap band: Secondary | ICD-10-CM

## 2013-01-29 NOTE — Progress Notes (Signed)
  HISTORY: Jacqueline Brooks is a 32 y.o.female who received an AP-Standard lap-band in December 2009 by Dr. Ezzard Standing. She comes in with 10 lbs weight gain since her last visit. She is getting progressively filled since fluid removal for air travel. She needs a fill today.  VITAL SIGNS: Filed Vitals:   01/29/13 1105  BP: 118/70  Pulse: 66  Resp: 14    PHYSICAL EXAM: Physical exam reveals a very well-appearing 32 y.o.female in no apparent distress Neurologic: Awake, alert, oriented Psych: Bright affect, conversant Respiratory: Breathing even and unlabored. No stridor or wheezing Abdomen: Soft, nontender, nondistended to palpation. Incisions well-healed. No incisional hernias. Port easily palpated. Extremities: Atraumatic, good range of motion.  ASSESMENT: 32 y.o.  female  s/p AP-Standard lap-band.   PLAN: The patient's port was accessed with a 20G Huber needle without difficulty. Clear fluid was aspirated and 1 mL saline was added to the port to give a total predicted volume of 5.5 mL. The patient was able to swallow water without difficulty following the procedure and was instructed to take clear liquids for the next 24-48 hours and advance slowly as tolerated.

## 2013-01-29 NOTE — Patient Instructions (Signed)

## 2013-03-26 ENCOUNTER — Encounter (INDEPENDENT_AMBULATORY_CARE_PROVIDER_SITE_OTHER): Payer: BC Managed Care – PPO

## 2013-04-16 ENCOUNTER — Encounter (INDEPENDENT_AMBULATORY_CARE_PROVIDER_SITE_OTHER): Payer: BC Managed Care – PPO

## 2014-01-11 ENCOUNTER — Encounter (INDEPENDENT_AMBULATORY_CARE_PROVIDER_SITE_OTHER): Payer: Self-pay

## 2015-01-26 ENCOUNTER — Ambulatory Visit
Admission: RE | Admit: 2015-01-26 | Discharge: 2015-01-26 | Disposition: A | Discharge status: Home / Self Care | Payer: BLUE CROSS/BLUE SHIELD | Source: Ambulatory Visit | Attending: General Surgery | Admitting: General Surgery

## 2015-01-26 ENCOUNTER — Other Ambulatory Visit: Payer: Self-pay | Admitting: General Surgery

## 2015-01-26 DIAGNOSIS — Z4651 Encounter for fitting and adjustment of gastric lap band: Secondary | ICD-10-CM

## 2016-05-09 ENCOUNTER — Other Ambulatory Visit: Payer: Self-pay | Admitting: Obstetrics

## 2016-05-09 DIAGNOSIS — N644 Mastodynia: Secondary | ICD-10-CM

## 2016-06-11 ENCOUNTER — Other Ambulatory Visit: Payer: Self-pay | Admitting: Obstetrics

## 2016-06-11 ENCOUNTER — Ambulatory Visit
Admission: RE | Admit: 2016-06-11 | Discharge: 2016-06-11 | Disposition: A | Discharge status: Home / Self Care | Payer: BLUE CROSS/BLUE SHIELD | Source: Ambulatory Visit | Attending: Obstetrics | Admitting: Obstetrics

## 2016-06-11 DIAGNOSIS — N6310 Unspecified lump in the right breast, unspecified quadrant: Secondary | ICD-10-CM

## 2016-06-11 DIAGNOSIS — N6489 Other specified disorders of breast: Secondary | ICD-10-CM

## 2016-06-11 DIAGNOSIS — N644 Mastodynia: Secondary | ICD-10-CM

## 2016-07-06 IMAGING — CR DG ABDOMEN 1V
1 series · 1 of 1 positions shown · non-contrast
Comparison: 02/17/2008

CLINICAL DATA: Abdominal pain and vomiting. Previous gastric lap
band.

EXAM:
ABDOMEN - 1 VIEW

[view not recorded]
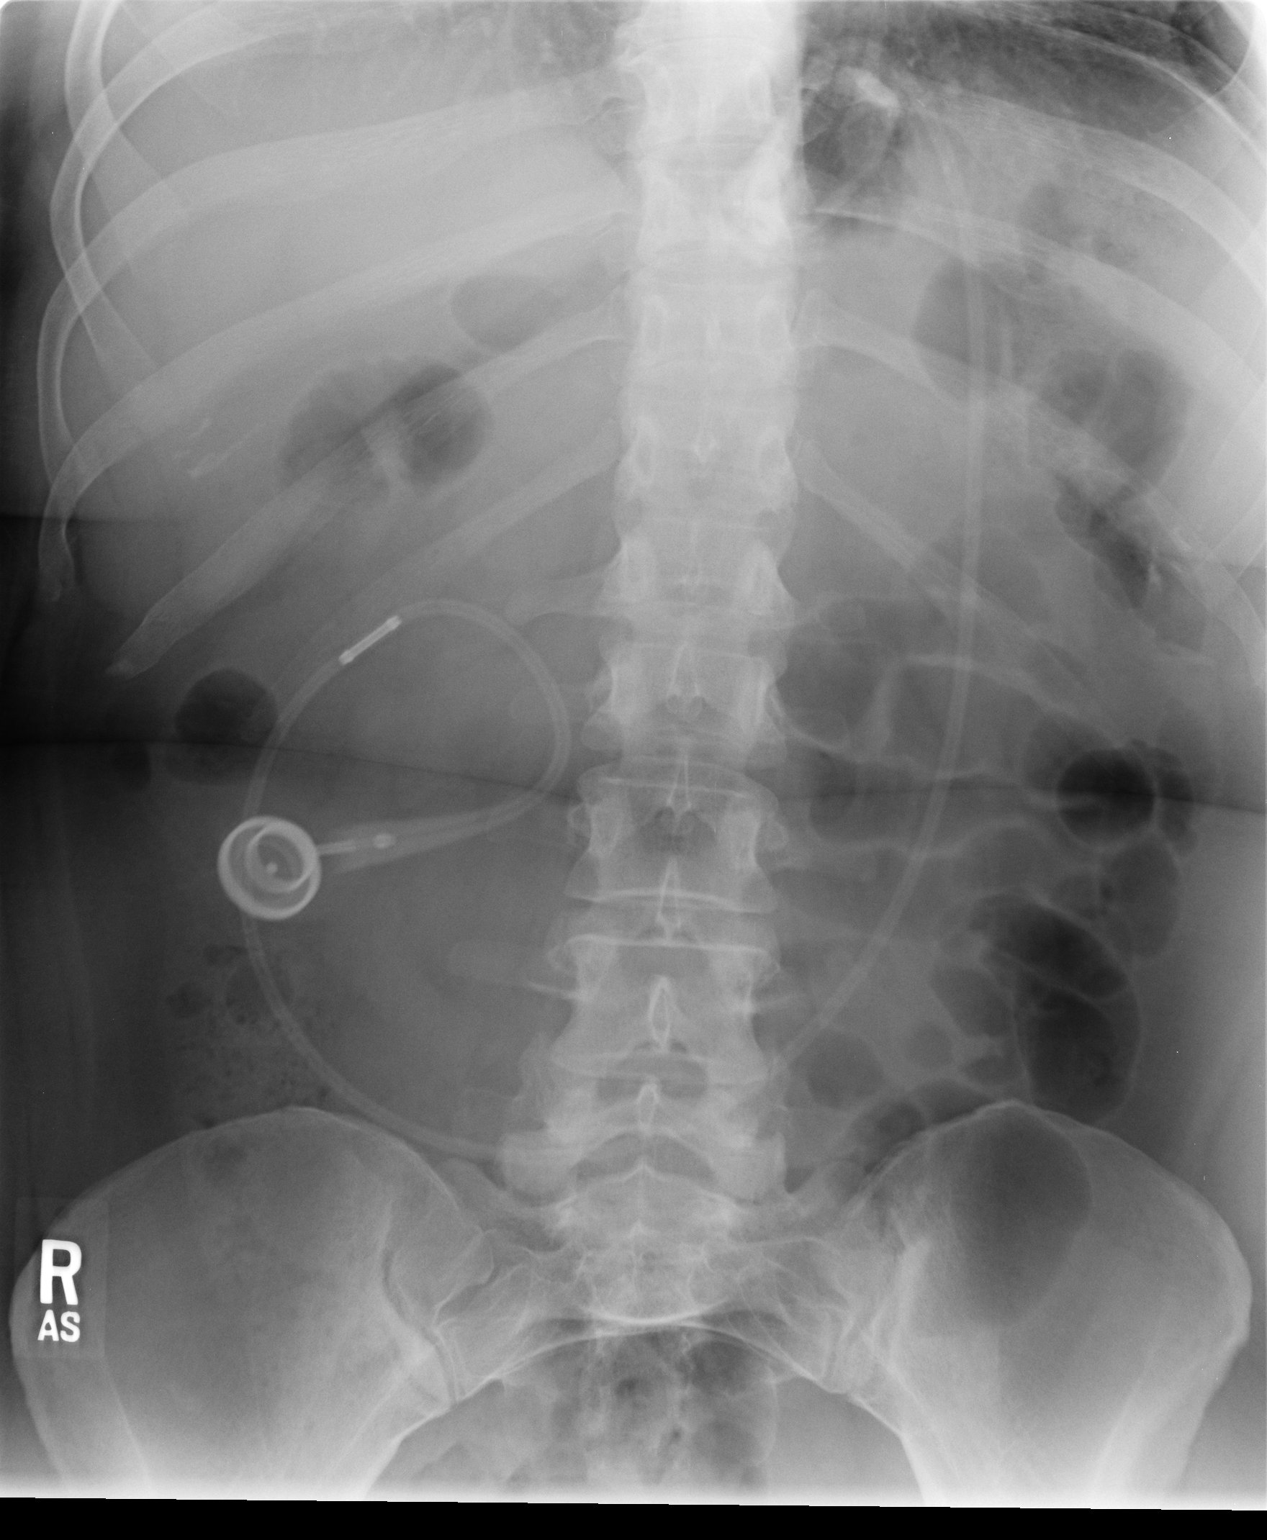

[1 of 1 positions shown; findings below may reference images not displayed]

FINDINGS: Lap band device appears in proper position in the correct
orientation. Bowel gas pattern is normal. No osseous abnormality.
IMPRESSION: Benign appearing abdomen.  Lap band appears to be in good position.

## 2019-06-26 HISTORY — PX: ABDOMINOPLASTY: SHX5355

## 2021-03-28 ENCOUNTER — Encounter: Payer: Self-pay | Admitting: *Deleted

## 2021-03-30 ENCOUNTER — Ambulatory Visit: Payer: BC Managed Care – PPO | Admitting: Psychiatry
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
## 2021-04-25 ENCOUNTER — Ambulatory Visit: Payer: BC Managed Care – PPO | Admitting: Psychiatry
